# Patient Record
Sex: Male | Born: 1970 | Race: Black or African American | Hispanic: No | Marital: Single | Smoking: Current every day smoker
Health system: Southern US, Community
[De-identification: ages and names within clinical notes are randomized; demographics above are authoritative.]

## PROBLEM LIST (undated history)

## (undated) DIAGNOSIS — M109 Gout, unspecified: Secondary | ICD-10-CM

## (undated) HISTORY — PX: TONSILLECTOMY: SUR1361

---

## 2004-04-22 ENCOUNTER — Emergency Department (HOSPITAL_COMMUNITY): Admission: EM | Admit: 2004-04-22 | Discharge: 2004-04-22 | Payer: Self-pay | Admitting: Emergency Medicine

## 2007-10-16 ENCOUNTER — Inpatient Hospital Stay (HOSPITAL_COMMUNITY): Admission: EM | Admit: 2007-10-16 | Discharge: 2007-10-19 | Payer: Self-pay | Admitting: Emergency Medicine

## 2008-03-05 ENCOUNTER — Emergency Department (HOSPITAL_COMMUNITY): Admission: EM | Admit: 2008-03-05 | Discharge: 2008-03-05 | Payer: Self-pay | Admitting: Family Medicine

## 2008-08-25 ENCOUNTER — Emergency Department (HOSPITAL_COMMUNITY): Admission: EM | Admit: 2008-08-25 | Discharge: 2008-08-25 | Payer: Self-pay | Admitting: *Deleted

## 2010-07-15 ENCOUNTER — Emergency Department (HOSPITAL_COMMUNITY)
Admission: EM | Admit: 2010-07-15 | Discharge: 2010-07-15 | Payer: Self-pay | Source: Home / Self Care | Admitting: Emergency Medicine

## 2010-07-20 LAB — DIFFERENTIAL
Basophils Absolute: 0 10*3/uL (ref 0.0–0.1)
Basophils Relative: 0 % (ref 0–1)
Eosinophils Absolute: 0.2 10*3/uL (ref 0.0–0.7)
Eosinophils Relative: 3 % (ref 0–5)
Lymphocytes Relative: 45 % (ref 12–46)
Lymphs Abs: 3 10*3/uL (ref 0.7–4.0)
Monocytes Absolute: 0.6 10*3/uL (ref 0.1–1.0)
Monocytes Relative: 8 % (ref 3–12)
Neutro Abs: 3 10*3/uL (ref 1.7–7.7)
Neutrophils Relative %: 44 % (ref 43–77)

## 2010-07-20 LAB — BASIC METABOLIC PANEL
BUN: 12 mg/dL (ref 6–23)
CO2: 24 mEq/L (ref 19–32)
Calcium: 9.3 mg/dL (ref 8.4–10.5)
Chloride: 108 mEq/L (ref 96–112)
Creatinine, Ser: 1.22 mg/dL (ref 0.4–1.5)
GFR calc Af Amer: >60 mL/min (ref >60)
GFR calc non Af Amer: >60 mL/min (ref >60)
Glucose, Bld: 119 mg/dL — ABNORMAL HIGH (ref 70–99)
Potassium: 3.8 mEq/L (ref 3.5–5.1)
Sodium: 144 mEq/L (ref 135–145)

## 2010-07-20 LAB — POCT CARDIAC MARKERS
CKMB, poc: <1 ng/mL — ABNORMAL LOW (ref 1.0–8.0)
CKMB, poc: <1 ng/mL — ABNORMAL LOW (ref 1.0–8.0)
CKMB, poc: <1 ng/mL — ABNORMAL LOW (ref 1.0–8.0)
Myoglobin, poc: 67.5 ng/mL (ref 12–200)
Myoglobin, poc: 61.2 ng/mL (ref 12–200)
Myoglobin, poc: 68.2 ng/mL (ref 12–200)
Troponin i, poc: <0.05 ng/mL (ref 0.00–0.09)
Troponin i, poc: <0.05 ng/mL (ref 0.00–0.09)
Troponin i, poc: <0.05 ng/mL (ref 0.00–0.09)

## 2010-07-20 LAB — CBC
HCT: 42.6 % (ref 39.0–52.0)
Hemoglobin: 14.8 g/dL (ref 13.0–17.0)
MCH: 31.2 pg (ref 26.0–34.0)
MCHC: 34.7 g/dL (ref 30.0–36.0)
MCV: 89.7 fL (ref 78.0–100.0)
Platelets: 302 10*3/uL (ref 150–400)
RBC: 4.75 MIL/uL (ref 4.22–5.81)
RDW: 14 % (ref 11.5–15.5)
WBC: 6.8 10*3/uL (ref 4.0–10.5)

## 2010-07-20 LAB — D-DIMER, QUANTITATIVE: D-Dimer, Quant: 0.23 ug/mL-FEU (ref 0.00–0.48)

## 2010-09-20 ENCOUNTER — Inpatient Hospital Stay (INDEPENDENT_AMBULATORY_CARE_PROVIDER_SITE_OTHER)
Admission: RE | Admit: 2010-09-20 | Discharge: 2010-09-20 | Disposition: A | Discharge status: Home / Self Care | Payer: Self-pay | Source: Ambulatory Visit | Attending: Family Medicine | Admitting: Family Medicine

## 2010-09-20 DIAGNOSIS — M109 Gout, unspecified: Secondary | ICD-10-CM

## 2010-09-20 LAB — URIC ACID: Uric Acid, Serum: 9.1 mg/dL — ABNORMAL HIGH (ref 4.0–7.8)

## 2010-10-20 LAB — DIFFERENTIAL
Basophils Absolute: 0.1 10*3/uL (ref 0.0–0.1)
Basophils Relative: 1 % (ref 0–1)
Eosinophils Absolute: 0.2 10*3/uL (ref 0.0–0.7)
Eosinophils Relative: 2 % (ref 0–5)
Lymphocytes Relative: 38 % (ref 12–46)
Lymphs Abs: 2.6 10*3/uL (ref 0.7–4.0)
Monocytes Absolute: 0.7 10*3/uL (ref 0.1–1.0)
Monocytes Relative: 10 % (ref 3–12)
Neutro Abs: 3.4 10*3/uL (ref 1.7–7.7)
Neutrophils Relative %: 49 % (ref 43–77)

## 2010-10-20 LAB — CBC
HCT: 41.9 % (ref 39.0–52.0)
Hemoglobin: 14.8 g/dL (ref 13.0–17.0)
MCHC: 35.3 g/dL (ref 30.0–36.0)
MCV: 88.8 fL (ref 78.0–100.0)
Platelets: 269 10*3/uL (ref 150–400)
RBC: 4.72 MIL/uL (ref 4.22–5.81)
RDW: 14.1 % (ref 11.5–15.5)
WBC: 6.9 10*3/uL (ref 4.0–10.5)

## 2010-10-20 LAB — BASIC METABOLIC PANEL
BUN: 10 mg/dL (ref 6–23)
CO2: 28 mEq/L (ref 19–32)
Calcium: 9.2 mg/dL (ref 8.4–10.5)
Chloride: 103 mEq/L (ref 96–112)
Creatinine, Ser: 1.27 mg/dL (ref 0.4–1.5)
GFR calc Af Amer: >60 mL/min (ref >60)
GFR calc non Af Amer: >60 mL/min (ref >60)
Glucose, Bld: 111 mg/dL — ABNORMAL HIGH (ref 70–99)
Potassium: 3.7 mEq/L (ref 3.5–5.1)
Sodium: 140 mEq/L (ref 135–145)

## 2010-10-20 LAB — D-DIMER, QUANTITATIVE: D-Dimer, Quant: <0.22 ug/mL-FEU (ref 0.00–0.48)

## 2010-10-20 LAB — POCT CARDIAC MARKERS
CKMB, poc: <1 ng/mL — ABNORMAL LOW (ref 1.0–8.0)
Myoglobin, poc: 57.7 ng/mL (ref 12–200)
Troponin i, poc: <0.05 ng/mL (ref 0.00–0.09)

## 2010-11-17 NOTE — H&P (Signed)
NAME:  Patrick Hodges, Patrick Hodges NO.:  [id number]   MEDICAL RECORD NO.:  [id number]          PATIENT TYPE:  INP   LOCATION:  0105                         FACILITY:  [facility]   PHYSICIAN:  Excell Seltzer. Annabell Howells, M.D.    DATE OF BIRTH:  26-Apr-1971   DATE OF ADMISSION:  10/16/2007  DATE OF DISCHARGE:                              HISTORY & PHYSICAL   CHIEF COMPLAINT:  Right scrotal pain and swelling.   HISTORY:  Patrick Hodges is a 40 year old African American male who had the  onset Friday of pain and swelling in the right scrotum with fever.  He  was sent to see Dr. Freida Busman who referred him to me because of the scrotal  location of the abscess.  He has had a fever as high as 103 in the  emergency room. He has no voiding complaints.  He has had a swollen  gland in the neck.  The abscess actually spontaneously ruptured. He had  a little knot in that area of the scrotum for about a week and a half  prior to it becoming of full blown abscess.   PAST HISTORY:  Is pertinent for no drug allergies.   No current medications.   PAST MEDICAL HISTORY:  Medical history is pertinent for prior  tonsillectomy.   FAMILY HISTORY:  Is pertinent for early prostate cancer and the death of  his grandfather with prostate cancer and father requiring treatment in  his 35s.   SOCIAL HISTORY:  Is a half pack a day smoker, drinks alcohol socially.  Denies drugs.  He is an unemployed Software engineer.   REVIEW OF SYSTEMS:  He has had fever, chills.  He had no voiding  complaints.  Denies chest pain or shortness of breath.  He does report  the swollen, tender node in the left neck and he denies any abdominal or  GI complaints.  He denies GI complaints. Has had no myalgias. He is  otherwise entirely without complaints.   PHYSICAL EXAMINATION:  His temperature is 101.1, respirations 18, pulse  was 105.  GENERAL:  He is a well-developed, well-nourished black male in no acute  distress, alert and oriented x3.  Head and face normocephalic, atraumatic.  NECK:  Supple with a tender left anterior cervical lymph node that  measures approximately 2 cm.  LUNGS:  Clear with normal effort.  HEART:  Regular rate and rhythm.  ABDOMEN:  Soft, mildly obese, nontender without mass or  hepatosplenomegaly or CVA tenderness.  There are no hernias, but there  is a right inguinal adenopathy with some induration and tenderness.  GU:  Exam reveals unremarkable phallus with adequate meatus.  There is  obvious swelling of the right hemi scrotum with drainage from an abscess  in the dependent portion of the scrotum. The drainage is greenish and  foul-smelling. The testicle feels uninvolved without masses or  tenderness on the right; the left testicle and epididymis are  unremarkable.  Anus and perineum without lesions.  RECTAL:  Exam reveals normal sphincter tone.  Prostate is 1+ in size,  benign in consistency without nodules. Seminal vesicles are  nonpalpable.  No rectal masses are noted.  Rectal exam was actually performed at the  time of surgical procedure.  EXTREMITIES:  Have full range of motion without edema.  SKIN:  Warm and dry.  There no other lesions other than on the scrotum.  NEUROLOGICAL:  He is grossly intact.   IMPRESSION:  1. Scrotal abscess with fever and adenopathy.  2. Family history of prostate cancer.   PLAN:  A PSA, CBC and CMET were obtained as well as an RPR and HIV  titer. He was started on Unasyn 3 g IV q. 6 and Cipro 400 mg IV q. 12 in  preparation for I&D of the scrotal abscess which will be done later this  evening. The risks were explained in detail.      Excell Seltzer. Annabell Howells, M.D.  Electronically Signed     JJW/MEDQ  D:  10/16/2007  T:  10/16/2007  Job:  [id number]   cc:   Lennie Muckle, MD  [address]   [address]  [place] [address]

## 2010-11-17 NOTE — Op Note (Signed)
NAME:  Patrick, Hodges NO.:  [id number]   MEDICAL RECORD NO.:  [id number]          PATIENT TYPE:  INP   LOCATION:  0105                         FACILITY:  [facility]   PHYSICIAN:  Excell Seltzer. Annabell Howells, M.D.    DATE OF BIRTH:  Jan 23, 1971   DATE OF PROCEDURE:  10/16/2007  DATE OF DISCHARGE:                               OPERATIVE REPORT   PROCEDURE:  Incision and drainage of scrotal abscess.   PREOPERATIVE DIAGNOSIS:  Scrotal abscess.   POSTOPERATIVE DIAGNOSIS:  Scrotal abscess.   SURGEON:  Excell Seltzer. Annabell Howells, MD   ANESTHESIA:  General.   SPECIMENS:  Aerobic and anaerobic wound cultures.   DRAIN:  1-inch Iodoform gauze pack.   COMPLICATIONS:  None.   INDICATIONS:  [name] is a 40 year old white male who presented with  the onset Friday of fever and right scrotal swelling.  He was initially  seen by general surgery and was referred to me for the presence of an  abscess in the scrotum  He was seen in the emergency room and found to  have a draining right scrotal abscess with induration into the inguinal  region with lymphadenopathy in the inguinal region, but also in the left  neck, left anterior cervical region.  He had a fever to 103.  It was  felt that I and D of the abscess in the OR was indicated because of the  possibility of a Fournier's gangrene.   FINDINGS AT PROCEDURE:  The patient was taken to the operating room  after receiving Cipro and Unasyn.  General anesthetic was induced.  He  was placed in the lithotomy position.  His left inguinal area was  clipped.  His scrotum was prepped with Betadine solution.  He was draped  in the usual sterile fashion.  The abscess head partially drained  through spontaneous rupture.  It was located on the right lateral aspect  of the dependent portion of the scrotum.  The opening was enlarged to  approximately 3 cm with Bovie uncut and additional purulent material  drained out.  Aerobic and anaerobic cultures were taken.  The  wound was  then probed and the tissues were separated up into the inguinal area.  There was no evidence of crepitus.  No evidence of necrotic fascial  material.  It appeared most consistent with a large gram negative rod  scrotal abscess.  Once the wound had been probed both superiorly and  inferiorly in the scrotum, and all pockets of infection broken down, the  wound was packed with 1-inch Iodoform gauze 1.4 yards.  Once the packing  was in good position the wound was cleansed.  Dressing of 4 x 4's,  Fluffs, Kerlix, and athletic supporter was supplied.   The patient was taken down from arthrotomy position.  His anesthetic was  reversed.  He was moved to the recovery room in stable condition.  There  were no complications.      Excell Seltzer. Annabell Howells, M.D.  Electronically Signed     JJW/MEDQ  D:  10/16/2007  T:  10/16/2007  Job:  [id number]

## 2010-11-20 NOTE — Discharge Summary (Signed)
NAME:  Patrick Hodges, Patrick Hodges NO.:  [id number]   MEDICAL RECORD NO.:  [id number]          PATIENT TYPE:  INP   LOCATION:  1336                         FACILITY:  [facility]   PHYSICIAN:  Excell Seltzer. Annabell Howells, M.D.    DATE OF BIRTH:  1970-10-12   DATE OF ADMISSION:  10/16/2007  DATE OF DISCHARGE:  10/19/2007                               DISCHARGE SUMMARY   Briefly, [name] is a 40 year old African American male who was seen  in the emergency room the Friday prior to admission with pain and  swelling in the right scrotum with fever.  He was sent see Dr. Lennie Muckle initially but was referred on to me because of the scrotal  location of the abscess.  He had had a fever as high as 103 in the  emergency room and had had spontaneous rupture of the abscess.  On  examination in the office, he had a draining lesion in the right lateral  inferior portion of the scrotum with some tenderness and induration  superior to the area.  It was felt that admission for I&D and IV  antibiotics was indicated.   ALLERGIES:  No drug allergies.   MEDICATIONS:  He was on no medications at admission.   MEDICAL HISTORY:  Significant for prior tonsillectomy.   FAMILY HISTORY:  Pertinent for prostate cancer.   HOSPITAL COURSE:  The patient was admitted.  A PSA, CBC, CMET, RPR and  HIV titer were obtained, and he was started on Unisom 3 grams IV and  Cipro 400 mg IV q.12h.  Later in the evening he was taken to the  operating room where he underwent incision and drainage of his right  scrotal abscess.  Wound cultures, both aerobic and anaerobic type, were  obtained.  He did not appear to have evidence of Fournier's gangrene,  but the abscess cavity did extend into the superior scrotum.  The cavity  was incised and packed generously with 1 inch Iodoform gauze.   Initial laboratory studies included a white count of 12.4, hemoglobin of  13.3.  Chemistries were unremarkable with the exception of total  bili  1.4 which is minimally elevated.  PSA was 0.91.  His urinalysis had 21-  50 white cells, 3-6 red cells and few bacteria.  His Gram stain of this  wound had moderate gram negative rods, moderate gram positive cocci.  Abscess cultures revealed multiple organisms on the aerobic and no  anaerobic bacteria.   On the first postoperative day, he remained sore.  He had had an  enlarged right anterior cervical node that he felt was smaller, but his  T-max is 102.9.  His HIV and RPR titers were both negative as well.   On October 18, 2007, he was feeling better.  His T-max was 102.3 in the  past 24 hours, but he was afebrile on the morning of the exam.  He had  continued improvement in the node in his neck and reduced erythema  around his scrotal site.  He was switched to p.o. Bactrim which was felt  to cover both staff  and gram negative rods.   On October 19, 2007, he remained afebrile without any complaints.  The  neck node has stabilized.  His GU exam revealed reduced erythema.  Approximately 6 inches of the pack was removed.   FINAL DIAGNOSIS:  Scrotal abscess with  sepsis and adenopathy.   COMPLICATIONS:  There were no complications during his admission.   DISCHARGE MEDICATIONS:  1. Bactrim DS one p.o. b.i.d.  2. Vicodin.   DISCHARGE INSTRUCTIONS:  He was instructed to follow up in two weeks for  reevaluation and remove the packing gradually over that period of time.   DISPOSITION:  To home.   PROGNOSIS:  Good.   CONDITION:  Improved.      Excell Seltzer. Annabell Howells, M.D.  Electronically Signed     JJW/MEDQ  D:  11/13/2007  T:  11/13/2007  Job:  [id number]

## 2011-03-30 LAB — PSA: PSA: 0.91

## 2011-03-30 LAB — URINALYSIS, ROUTINE W REFLEX MICROSCOPIC
Bilirubin Urine: NEGATIVE
Glucose, UA: NEGATIVE
Ketones, ur: NEGATIVE
Nitrite: NEGATIVE
Protein, ur: NEGATIVE
Specific Gravity, Urine: 1.022
Urobilinogen, UA: 1
pH: 5.5

## 2011-03-30 LAB — CBC
HCT: 39.8
Hemoglobin: 13.9
MCHC: 34.9
MCV: 89
Platelets: 270
RBC: 4.47
RDW: 14.1
WBC: 12.4 — ABNORMAL HIGH

## 2011-03-30 LAB — ANAEROBIC CULTURE

## 2011-03-30 LAB — COMPREHENSIVE METABOLIC PANEL
ALT: 29
AST: 28
Albumin: 3.6
Alkaline Phosphatase: 57
BUN: 8
CO2: 27
Calcium: 8.8
Chloride: 104
Creatinine, Ser: 1.36
GFR calc Af Amer: >60
GFR calc non Af Amer: 59 — ABNORMAL LOW
Glucose, Bld: 85
Potassium: 3.6
Sodium: 137
Total Bilirubin: 1.4 — ABNORMAL HIGH
Total Protein: 6.9

## 2011-03-30 LAB — DIFFERENTIAL
Basophils Absolute: 0
Basophils Relative: 0
Eosinophils Absolute: 0.1
Eosinophils Relative: 1
Lymphocytes Relative: 10 — ABNORMAL LOW
Lymphs Abs: 1.3
Monocytes Absolute: 1.5 — ABNORMAL HIGH
Monocytes Relative: 12
Neutro Abs: 9.6 — ABNORMAL HIGH
Neutrophils Relative %: 77

## 2011-03-30 LAB — URINE MICROSCOPIC-ADD ON

## 2011-03-30 LAB — GRAM STAIN

## 2011-03-30 LAB — RPR: RPR Ser Ql: NONREACTIVE

## 2011-03-30 LAB — CULTURE, ROUTINE-ABSCESS

## 2011-03-30 LAB — HIV ANTIBODY (ROUTINE TESTING W REFLEX): HIV: NONREACTIVE

## 2013-11-12 ENCOUNTER — Encounter (HOSPITAL_COMMUNITY): Payer: Self-pay | Admitting: Emergency Medicine

## 2013-11-12 ENCOUNTER — Emergency Department (HOSPITAL_COMMUNITY)
Admission: EM | Admit: 2013-11-12 | Discharge: 2013-11-12 | Disposition: A | Discharge status: Home / Self Care | Payer: Self-pay | Attending: Emergency Medicine | Admitting: Emergency Medicine

## 2013-11-12 DIAGNOSIS — M62830 Muscle spasm of back: Secondary | ICD-10-CM

## 2013-11-12 DIAGNOSIS — F17200 Nicotine dependence, unspecified, uncomplicated: Secondary | ICD-10-CM | POA: Insufficient documentation

## 2013-11-12 DIAGNOSIS — M5430 Sciatica, unspecified side: Secondary | ICD-10-CM | POA: Insufficient documentation

## 2013-11-12 DIAGNOSIS — M5431 Sciatica, right side: Secondary | ICD-10-CM

## 2013-11-12 DIAGNOSIS — Z8639 Personal history of other endocrine, nutritional and metabolic disease: Secondary | ICD-10-CM | POA: Insufficient documentation

## 2013-11-12 DIAGNOSIS — Z862 Personal history of diseases of the blood and blood-forming organs and certain disorders involving the immune mechanism: Secondary | ICD-10-CM | POA: Insufficient documentation

## 2013-11-12 HISTORY — DX: Gout, unspecified: M10.9

## 2013-11-12 MED ORDER — METHOCARBAMOL 500 MG PO TABS
1000.0000 mg | ORAL_TABLET | Freq: Once | ORAL | Status: AC
  Administered 2013-11-12: 1000 mg via ORAL
  Filled 2013-11-12: qty 2

## 2013-11-12 MED ORDER — METHOCARBAMOL 500 MG PO TABS: 500.0000 mg | ORAL_TABLET | Freq: Two times a day (BID) | ORAL | Status: DC

## 2013-11-12 MED ORDER — MELOXICAM 7.5 MG PO TABS: 15.0000 mg | ORAL_TABLET | Freq: Every day | ORAL | Status: DC

## 2013-11-12 MED ORDER — OXYCODONE-ACETAMINOPHEN 5-325 MG PO TABS
1.0000 | ORAL_TABLET | Freq: Once | ORAL | Status: AC
  Administered 2013-11-12: 1 via ORAL
  Filled 2013-11-12: qty 1

## 2013-11-12 MED ORDER — KETOROLAC TROMETHAMINE 60 MG/2ML IM SOLN
60.0000 mg | Freq: Once | INTRAMUSCULAR | Status: DC
  Filled 2013-11-12: qty 2

## 2013-11-12 MED ORDER — OXYCODONE-ACETAMINOPHEN 5-325 MG PO TABS: ORAL_TABLET | ORAL | Status: DC

## 2013-11-12 NOTE — ED Provider Notes (Signed)
CSN: [id number]     Arrival date & time 11/12/13  0759 History   First MD Initiated Contact with Patient 11/12/13 [phone]     Chief Complaint  Patient presents with  . Back Pain     (Consider location/radiation/quality/duration/timing/severity/associated sxs/prior Treatment) HPI Pt is a 43yo male with hx of gout c/o right sided low back pain x2 days, gradually worsening, sharp and stabbing in nature, worse with ambulation and certain movements, 10/10. Pain radiates down right leg, stopping at the knee. Minimal relief with Aleve. Denies hx of trauma. Denies previous back pain or symptoms.  Denies fever, n/v/d. Denies hx of IVDU or Cancer. Denies change in urinary or bladder habits.   Past Medical History  Diagnosis Date  . Gout    Past Surgical History  Procedure Laterality Date  . Tonsillectomy     No family history on file. History  Substance Use Topics  . Smoking status: Current Every Day Smoker    Types: Cigarettes  . Smokeless tobacco: Never Used  . Alcohol Use: Yes     Comment: occasion    Review of Systems  Constitutional: Negative for fever and chills.  Respiratory: Negative for shortness of breath.   Cardiovascular: Negative for chest pain.  Gastrointestinal: Negative for nausea and vomiting.  Musculoskeletal: Positive for back pain and myalgias.  Skin: Negative for rash and wound.  Neurological: Negative for weakness and numbness.  All other systems reviewed and are negative.    Allergies  Review of patient's allergies indicates no known allergies.  Home Medications   Prior to Admission medications   Medication Sig Start Date End Date Taking? Authorizing Provider  meloxicam (MOBIC) 7.5 MG tablet Take 2 tablets (15 mg total) by mouth daily. 11/12/13   Junius FinnerErin O'Malley, PA-C  methocarbamol (ROBAXIN) 500 MG tablet Take 1 tablet (500 mg total) by mouth 2 (two) times daily. 11/12/13   Junius FinnerErin O'Malley, PA-C  oxyCODONE-acetaminophen (PERCOCET/ROXICET) 5-325 MG per tablet  Take 1-2 pills every 4-6 hours as needed for pain. 11/12/13   Junius FinnerErin O'Malley, PA-C   BP 130/83  Pulse 65  Temp(Src) 97.9 F (36.6 C) (Oral)  Resp 18  Ht 6' (1.829 m)  Wt 240 lb (108.863 kg)  BMI 32.54 kg/m2  SpO2 100% Physical Exam  Nursing note and vitals reviewed. Constitutional: He appears well-developed and well-nourished.  HENT:  Head: Normocephalic and atraumatic.  Eyes: Conjunctivae are normal. No scleral icterus.  Neck: Normal range of motion.  Cardiovascular: Normal rate, regular rhythm and normal heart sounds.   Pulmonary/Chest: Effort normal and breath sounds normal. No respiratory distress. He has no wheezes. He has no rales. He exhibits no tenderness.  Abdominal: Soft. Bowel sounds are normal. He exhibits no distension and no mass. There is no tenderness. There is no rebound and no guarding.  Musculoskeletal: Normal range of motion. He exhibits tenderness. He exhibits no edema.  Tenderness over right lower lumbar musculature. No midline spinal tenderness. FROM all 4 extremities. 5/5 grip strength, plantar flexion and dorsiflexion.  Neurological: He is alert.  Reflex Scores:      Patellar reflexes are 2+ on the right side and 2+ on the left side. Antalgic gait  Skin: Skin is warm and dry.    ED Course  Procedures (including critical care time) Labs Review Labs Reviewed - No data to display  Imaging Review No results found.   EKG Interpretation None      MDM   Final diagnoses:  Back muscle spasm  Right sciatic  nerve pain    Pt c/o low back pain, no hx of trauma. No red flag symptoms. Will tx for musculoskeletal pain. [facility]. Return precautions provided. Pt verbalized understanding and agreement with tx plan.     Junius Finnerrin O'Malley, PA-C 11/12/13 1445

## 2013-11-12 NOTE — Progress Notes (Signed)
[facility] did not get to see patient but will be sending information about [facility] [employer] Card to help patient establish primary care, using the address provided.  °

## 2013-11-12 NOTE — ED Notes (Signed)
Pt c/o mid/lower back pain that started on Saturday. Pt denies lifting, moving, injuring that could cause the pain. Pt states that he is having trouble walking,  Sitting, moving bc of the back pain.  Pt denies ever having back pain before.

## 2013-11-15 NOTE — ED Provider Notes (Signed)
Medical screening examination/treatment/procedure(s) were performed by non-physician practitioner and as supervising physician I was immediately available for consultation/collaboration.   EKG Interpretation None        Laray AngerKathleen M Monti Villers, DO 11/15/13 1025

## 2014-01-03 ENCOUNTER — Encounter (HOSPITAL_COMMUNITY): Payer: Self-pay | Admitting: Emergency Medicine

## 2014-01-03 ENCOUNTER — Emergency Department (HOSPITAL_COMMUNITY)
Admission: EM | Admit: 2014-01-03 | Discharge: 2014-01-03 | Disposition: A | Discharge status: Home / Self Care | Payer: Self-pay | Attending: Emergency Medicine | Admitting: Emergency Medicine

## 2014-01-03 DIAGNOSIS — F17200 Nicotine dependence, unspecified, uncomplicated: Secondary | ICD-10-CM | POA: Insufficient documentation

## 2014-01-03 DIAGNOSIS — Z791 Long term (current) use of non-steroidal anti-inflammatories (NSAID): Secondary | ICD-10-CM | POA: Insufficient documentation

## 2014-01-03 DIAGNOSIS — Z79899 Other long term (current) drug therapy: Secondary | ICD-10-CM | POA: Insufficient documentation

## 2014-01-03 DIAGNOSIS — M109 Gout, unspecified: Secondary | ICD-10-CM | POA: Insufficient documentation

## 2014-01-03 MED ORDER — INDOMETHACIN 50 MG PO CAPS: 50.0000 mg | ORAL_CAPSULE | Freq: Three times a day (TID) | ORAL | Status: DC

## 2014-01-03 MED ORDER — HYDROCODONE-ACETAMINOPHEN 5-325 MG PO TABS: 1.0000 | ORAL_TABLET | Freq: Four times a day (QID) | ORAL | Status: DC | PRN

## 2014-01-03 NOTE — ED Notes (Signed)
Pt c/o gout in left knee for past couple pf days. Pt states that he does have a PMH gout.

## 2014-01-03 NOTE — Progress Notes (Signed)
[facility] provided pt with a list of primary care resources and a [facility] [employer] application to help patient establish primary care.  °

## 2014-01-03 NOTE — ED Provider Notes (Addendum)
CSN: [id number]     Arrival date & time 01/03/14  0911 History   First MD Initiated Contact with Patient 01/03/14 [phone]     Chief Complaint  Patient presents with  . Gout     (Consider location/radiation/quality/duration/timing/severity/associated sxs/prior Treatment) Patient is a 43 y.o. male presenting with knee pain. The history is provided by the patient.  Knee Pain Location:  Knee Time since incident:  3 days Injury: no   Knee location:  L knee Pain details:    Quality:  Aching, throbbing and shooting   Radiates to:  Does not radiate   Severity:  Severe   Onset quality:  Gradual   Duration:  3 days   Timing:  Constant   Progression:  Worsening Chronicity:  Recurrent Relieved by:  Nothing Worsened by:  Activity, bearing weight, flexion and extension Ineffective treatments:  NSAIDs Associated symptoms: swelling   Associated symptoms: no fever and no muscle weakness   Risk factors comment:  Hx of gout   Past Medical History  Diagnosis Date  . Gout    Past Surgical History  Procedure Laterality Date  . Tonsillectomy     No family history on file. History  Substance Use Topics  . Smoking status: Current Every Day Smoker    Types: Cigarettes  . Smokeless tobacco: Never Used  . Alcohol Use: Yes     Comment: occasion    Review of Systems  Constitutional: Negative for fever.  All other systems reviewed and are negative.     Allergies  Review of patient's allergies indicates no known allergies.  Home Medications   Prior to Admission medications   Medication Sig Start Date End Date Taking? Authorizing Provider  meloxicam (MOBIC) 7.5 MG tablet Take 2 tablets (15 mg total) by mouth daily. 11/12/13   Junius FinnerErin O'Malley, PA-C  methocarbamol (ROBAXIN) 500 MG tablet Take 1 tablet (500 mg total) by mouth 2 (two) times daily. 11/12/13   Junius FinnerErin O'Malley, PA-C  oxyCODONE-acetaminophen (PERCOCET/ROXICET) 5-325 MG per tablet Take 1-2 pills every 4-6 hours as needed for pain.  11/12/13   Junius FinnerErin O'Malley, PA-C   BP 132/83  Pulse 81  Temp(Src) 98.4 F (36.9 C) (Oral)  Resp 17  SpO2 99% Physical Exam  Nursing note and vitals reviewed. Constitutional: He is oriented to person, place, and time. He appears well-developed and well-nourished. No distress.  HENT:  Head: Normocephalic and atraumatic.  Cardiovascular: Normal rate.   Pulmonary/Chest: Effort normal.  Musculoskeletal: He exhibits tenderness.       Left knee: He exhibits swelling. He exhibits normal range of motion and no erythema. Tenderness found. Medial joint line and lateral joint line tenderness noted.       Legs: Neurological: He is alert and oriented to person, place, and time.  Skin: Skin is warm and dry. No rash noted. No erythema.  Psychiatric: He has a normal mood and affect. His behavior is normal.    ED Course  Procedures (including critical care time) Labs Review Labs Reviewed - No data to display  Imaging Review No results found.   EKG Interpretation None      MDM   Final diagnoses:  Acute gout of left knee, unspecified cause    Patient with evidence of an acute gouty flare in his left knee. This is recurrent his last attack was approximately 2 years ago in the same knee.  No fever, or recent procedures were sign of a septic joint. Patient states in the past indomethacin has always resolved  his pain. He has no prior history of kidney issues and is otherwise healthy. Patient given indomethacin and followup.      Gwyneth SproutWhitney Keya Wynes, MD 01/03/14 [id number]  Gwyneth SproutWhitney Datra Clary, MD 01/03/14 [phone]

## 2015-10-28 ENCOUNTER — Emergency Department (HOSPITAL_COMMUNITY): Payer: Self-pay

## 2015-10-28 ENCOUNTER — Encounter (HOSPITAL_COMMUNITY): Payer: Self-pay

## 2015-10-28 ENCOUNTER — Emergency Department (HOSPITAL_COMMUNITY)
Admission: EM | Admit: 2015-10-28 | Discharge: 2015-10-28 | Disposition: A | Discharge status: Home / Self Care | Payer: Self-pay | Attending: Emergency Medicine | Admitting: Emergency Medicine

## 2015-10-28 DIAGNOSIS — R05 Cough: Secondary | ICD-10-CM

## 2015-10-28 DIAGNOSIS — M109 Gout, unspecified: Secondary | ICD-10-CM | POA: Insufficient documentation

## 2015-10-28 DIAGNOSIS — F17210 Nicotine dependence, cigarettes, uncomplicated: Secondary | ICD-10-CM | POA: Insufficient documentation

## 2015-10-28 DIAGNOSIS — R0789 Other chest pain: Secondary | ICD-10-CM | POA: Insufficient documentation

## 2015-10-28 DIAGNOSIS — J189 Pneumonia, unspecified organism: Secondary | ICD-10-CM

## 2015-10-28 DIAGNOSIS — Z79899 Other long term (current) drug therapy: Secondary | ICD-10-CM | POA: Insufficient documentation

## 2015-10-28 DIAGNOSIS — R059 Cough, unspecified: Secondary | ICD-10-CM

## 2015-10-28 DIAGNOSIS — J159 Unspecified bacterial pneumonia: Secondary | ICD-10-CM | POA: Insufficient documentation

## 2015-10-28 LAB — URINALYSIS, ROUTINE W REFLEX MICROSCOPIC
Bilirubin Urine: NEGATIVE
Glucose, UA: NEGATIVE mg/dL
Ketones, ur: NEGATIVE mg/dL
Nitrite: NEGATIVE
Protein, ur: NEGATIVE mg/dL
Specific Gravity, Urine: 1.025 (ref 1.005–1.030)
pH: 5.5 (ref 5.0–8.0)

## 2015-10-28 LAB — CBC
HCT: 40.8 % (ref 39.0–52.0)
Hemoglobin: 14.4 g/dL (ref 13.0–17.0)
MCH: 30.4 pg (ref 26.0–34.0)
MCHC: 35.3 g/dL (ref 30.0–36.0)
MCV: 86.3 fL (ref 78.0–100.0)
Platelets: 282 10*3/uL (ref 150–400)
RBC: 4.73 MIL/uL (ref 4.22–5.81)
RDW: 14 % (ref 11.5–15.5)
WBC: 9.3 10*3/uL (ref 4.0–10.5)

## 2015-10-28 LAB — I-STAT TROPONIN, ED: Troponin i, poc: 0 ng/mL (ref 0.00–0.08)

## 2015-10-28 LAB — TROPONIN I: Troponin I: <0.03 ng/mL (ref <0.031)

## 2015-10-28 LAB — I-STAT CHEM 8, ED
BUN: 18 mg/dL (ref 6–20)
Calcium, Ion: 1.14 mmol/L (ref 1.12–1.23)
Chloride: 102 mmol/L (ref 101–111)
Creatinine, Ser: 1.3 mg/dL — ABNORMAL HIGH (ref 0.61–1.24)
Glucose, Bld: 144 mg/dL — ABNORMAL HIGH (ref 65–99)
HCT: 45 % (ref 39.0–52.0)
Hemoglobin: 15.3 g/dL (ref 13.0–17.0)
Potassium: 5.6 mmol/L — ABNORMAL HIGH (ref 3.5–5.1)
Sodium: 139 mmol/L (ref 135–145)
TCO2: 30 mmol/L (ref 0–100)

## 2015-10-28 LAB — BRAIN NATRIURETIC PEPTIDE: B Natriuretic Peptide: 14.4 pg/mL (ref 0.0–100.0)

## 2015-10-28 LAB — POTASSIUM: Potassium: 3.7 mmol/L (ref 3.5–5.1)

## 2015-10-28 LAB — URINE MICROSCOPIC-ADD ON

## 2015-10-28 MED ORDER — OXYCODONE-ACETAMINOPHEN 5-325 MG PO TABS: 1.0000 | ORAL_TABLET | Freq: Four times a day (QID) | ORAL | Status: DC | PRN

## 2015-10-28 MED ORDER — LEVOFLOXACIN 500 MG PO TABS
500.0000 mg | ORAL_TABLET | Freq: Once | ORAL | Status: AC
  Administered 2015-10-28: 500 mg via ORAL
  Filled 2015-10-28: qty 1

## 2015-10-28 MED ORDER — SODIUM POLYSTYRENE SULFONATE 15 GM/60ML PO SUSP
15.0000 g | Freq: Once | ORAL | Status: AC
  Administered 2015-10-28: 15 g via ORAL
  Filled 2015-10-28: qty 60

## 2015-10-28 MED ORDER — MOXIFLOXACIN HCL 400 MG PO TABS: 400.0000 mg | ORAL_TABLET | Freq: Every day | ORAL | Status: DC

## 2015-10-28 MED ORDER — OXYCODONE-ACETAMINOPHEN 5-325 MG PO TABS
1.0000 | ORAL_TABLET | Freq: Once | ORAL | Status: AC
  Administered 2015-10-28: 1 via ORAL
  Filled 2015-10-28: qty 1

## 2015-10-28 NOTE — Discharge Instructions (Signed)
Return to the ED with any concerns including difficulty breathing, worsening pain, fainting, vomiting, or any other alarming symptoms  You should be sure to use the incentive spirometer 10 times every hour while awake

## 2015-10-28 NOTE — ED Provider Notes (Signed)
7:08 PM Called by Joyce GrossKay in flow manager's office. Pharmacy called as patient is unable to afford the Avelox. Requesting change to generic Levaquin or to Ciprofloxacin. Verbal order given for Levaquin 750mg  QD x 5 days.  Antony MaduraKelly Kuba Shepherd, PA-C 10/28/15 1910  Rolan BuccoMelanie Belfi, MD 10/28/15 2041

## 2015-10-28 NOTE — ED Provider Notes (Signed)
CSN: [id number]     Arrival date & time 10/28/15  0547 History   First MD Initiated Contact with Patient 10/28/15 0756     Chief Complaint  Patient presents with  . Chest Pain     (Consider location/radiation/quality/duration/timing/severity/associated sxs/prior Treatment) HPI  Pt presenting with c/o left sided chest wall pain as well as pain in upper left back. He states he has also had cough- pain is worse with coughing.  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  He also states the cough started at that same time.  No fever/chlls.  No leg swelling.  No hx of DVT/PE, no recent travel/trauma/surgery.  He has not had any treatment prior to arrival.  There are no other associated systemic symptoms, there are no other alleviating or modifying factors.   Past Medical History  Diagnosis Date  . Gout    Past Surgical History  Procedure Laterality Date  . Tonsillectomy     History reviewed. No pertinent family history. Social History  Substance Use Topics  . Smoking status: Current Every Day Smoker    Types: Cigarettes  . Smokeless tobacco: Never Used  . Alcohol Use: Yes     Comment: occasion    Review of Systems  ROS reviewed and all otherwise negative except for mentioned in HPI    Allergies  Review of patient's allergies indicates no known allergies.  Home Medications   Prior to Admission medications   Medication Sig Start Date End Date Taking? Authorizing Provider  Doxylamine Succinate, Sleep, (SLEEP AID PO) Take 2 tablets by mouth at bedtime as needed (sleep).   Yes Historical Provider, MD  indomethacin (INDOCIN) 50 MG capsule Take 1 capsule (50 mg total) by mouth 3 (three) times daily with meals. Stop medication when pain resolves Patient taking differently: Take 50 mg by mouth 2 (two) times daily as  needed (gout pain).  01/03/14  Yes Gwyneth Sprout, MD  naproxen sodium (ANAPROX) 220 MG tablet Take 880-1,100 mg by mouth 2 (two) times daily as needed (pain).   Yes Historical Provider, MD  Omega-3 Fatty Acids (FISH OIL) 1000 MG CAPS Take 3,000 mg by mouth daily.   Yes Historical Provider, MD  moxifloxacin (AVELOX) 400 MG tablet Take 1 tablet (400 mg total) by mouth daily at 8 pm. 10/28/15   Jerelyn Scott, MD  oxyCODONE-acetaminophen (PERCOCET/ROXICET) 5-325 MG tablet Take 1-2 tablets by mouth every 6 (six) hours as needed for severe pain. 10/28/15   Jerelyn Scott, MD   BP 123/94 mmHg  Pulse 84  Temp(Src) 98.6 F (37 C) (Oral)  Resp 15  Ht 6' (1.829 m)  Wt 104.327 kg  BMI 31.19 kg/m2  SpO2 98%  Vitals reviewed Physical Exam  Physical Examination: General appearance - alert, well appearing, and in no distress Mental status - alert, oriented to person, place, and time Eyes - no conjunctival injection, no scleral icterus Mouth - mucous membranes moist, pharynx normal without lesions Neck - supple, no significant adenopathy Chest - clear to auscultation, no wheezes, rales or rhonchi, symmetric air entry, ttp over left lateral chest wall and left posterior chest wall/upper back, normal respiratory effort Heart - normal rate, regular rhythm, normal S1, S2, no murmurs, rubs, clicks or gallops Abdomen - soft, nontender, nondistended, no masses or organomegaly Neurological - alert, oriented,  normal speech Extremities - peripheral pulses normal, no pedal edema, no clubbing or cyanosis Skin - normal coloration and turgor, no rashes  ED Course  Procedures (including critical care time) Labs Review Labs Reviewed  URINALYSIS, ROUTINE W REFLEX MICROSCOPIC (NOT AT [facility]) - Abnormal; Notable for the following:    Hgb urine dipstick SMALL (*)    Leukocytes, UA SMALL (*)    All other components within normal limits  URINE MICROSCOPIC-ADD ON - Abnormal; Notable for the following:    Squamous  Epithelial / LPF 0-5 (*)    Bacteria, UA FEW (*)    All other components within normal limits  I-STAT CHEM 8, ED - Abnormal; Notable for the following:    Potassium 5.6 (*)    Creatinine, Ser 1.30 (*)    Glucose, Bld 144 (*)    All other components within normal limits  CBC  BRAIN NATRIURETIC PEPTIDE  TROPONIN I  POTASSIUM  I-STAT TROPOININ, ED    Imaging Review Dg Chest 2 View  10/28/2015  CLINICAL DATA:  Initial evaluation for acute chest pain. EXAM: CHEST  2 VIEW COMPARISON:  Prior study from 07/15/2010. FINDINGS: Cardiac and mediastinal silhouettes are within normal limits. Lungs are normally inflated. Mild left basilar patchy and linear opacity, which may reflect atelectasis or infiltrate. No other focal airspace disease. No pulmonary edema or pleural effusion. No pneumothorax. No acute osseus abnormality. IMPRESSION: Patchy and linear left basilar opacity. Finding may reflect atelectasis or infiltrate. Electronically Signed   By: Rise MuBenjamin  McClintock M.D.   On: 10/28/2015 06:27   I have personally reviewed and evaluated these images and lab results as part of my medical decision-making.   EKG Interpretation   Date/Time:  Tuesday October 28 2015 05:55:06 EDT Ventricular Rate:  100 PR Interval:  151 QRS Duration: 91 QT Interval:  329 QTC Calculation: 424 R Axis:   -101 Text Interpretation:  Sinus tachycardia Ventricular premature complex  Markedly posterior QRS axis Baseline wander in lead(s) II III aVR aVF V1  No significant change since last tracing Confirmed by [facility]  MD, MARTHA  [phone]([id number]) on 10/28/2015 8:23:09 AM      MDM   Final diagnoses:  Cough  Chest wall pain  Community acquired pneumonia    Pt presenting with cough and chest wall pain.  CXR shows left sided infiltrate.  Pt given pain medication, incentive spirometer as he is splinting due to pain, also started on abx.  Labs are reassuring, doubt ACS, doubt PE or other acute emergent process.  Discharged with  strict return precautions.  Pt agreeable with plan.   Jerelyn ScottMartha Linker, MD 10/29/15 [phone]

## 2015-10-28 NOTE — ED Notes (Signed)
Pt complains of chest pain for 4 days, he describes it as pressure and like someone hit him,

## 2017-02-14 ENCOUNTER — Encounter (HOSPITAL_COMMUNITY): Payer: Self-pay | Admitting: Emergency Medicine

## 2017-02-14 ENCOUNTER — Emergency Department (HOSPITAL_COMMUNITY)
Admission: EM | Admit: 2017-02-14 | Discharge: 2017-02-14 | Disposition: A | Discharge status: Home / Self Care | Payer: BLUE CROSS/BLUE SHIELD | Attending: Emergency Medicine | Admitting: Emergency Medicine

## 2017-02-14 DIAGNOSIS — M25562 Pain in left knee: Secondary | ICD-10-CM | POA: Diagnosis present

## 2017-02-14 DIAGNOSIS — M10062 Idiopathic gout, left knee: Secondary | ICD-10-CM | POA: Insufficient documentation

## 2017-02-14 DIAGNOSIS — F17210 Nicotine dependence, cigarettes, uncomplicated: Secondary | ICD-10-CM | POA: Insufficient documentation

## 2017-02-14 DIAGNOSIS — Z79899 Other long term (current) drug therapy: Secondary | ICD-10-CM | POA: Diagnosis not present

## 2017-02-14 MED ORDER — INDOMETHACIN 25 MG PO CAPS
50.0000 mg | ORAL_CAPSULE | Freq: Once | ORAL | Status: AC
  Administered 2017-02-14: 50 mg via ORAL
  Filled 2017-02-14: qty 2

## 2017-02-14 MED ORDER — INDOMETHACIN 50 MG PO CAPS: 50.0000 mg | ORAL_CAPSULE | Freq: Two times a day (BID) | ORAL | 0 refills | Status: DC

## 2017-02-14 NOTE — ED Provider Notes (Signed)
[facility]-EMERGENCY DEPT Provider Note   CSN: [id number] Arrival date & time: 02/14/17  1341   By signing my name below, I, [name], attest that this documentation has been prepared under the direction and in the presence of Kerrie Buffalo, NP Electronically Signed: [name], ED Scribe. 02/14/17. 4:18 PM.  History   Chief Complaint Chief Complaint  Patient presents with  . Gout   The history is provided by the patient. No language interpreter was used.  Knee Pain   This is a new problem. The current episode started 12 to 24 hours ago. The problem has been gradually worsening. The pain is present in the left knee and left elbow. The pain is moderate. Pertinent negatives include no numbness and full range of motion (pain with ROM.). Family history is significant for gout.   HPI Comments: Patrick Hodges is a 46 y.o. male with a h/x of gout, who presents to the Emergency Department complaining of gradually worsening, moderate left elbow and left knee pain with associated joint swelling that began this morning. The pt states that his pain is similar to his gout in other locations, but his current symptoms are more severe. Ambulation and movement of these joints exacerbate his pain. The pt states that he took some NSAIDs with no relief of his symptoms. He denies fever, chills, SOB, abdominal pain, diarrhea, nausea, and emesis. He reports that when it gets this bad he usually needs Indocin.   Past Medical History:  Diagnosis Date  . Gout     There are no active problems to display for this patient.   Past Surgical History:  Procedure Laterality Date  . TONSILLECTOMY         Home Medications    Prior to Admission medications   Medication Sig Start Date End Date Taking? Authorizing Provider  Doxylamine Succinate, Sleep, (SLEEP AID PO) Take 2 tablets by mouth at bedtime as needed (sleep).    [provider]  indomethacin (INDOCIN) 50 MG capsule Take 1 capsule (50  mg total) by mouth 2 (two) times daily with a meal. 02/14/17   Damian Leavell, [place], NP  moxifloxacin (AVELOX) 400 MG tablet Take 1 tablet (400 mg total) by mouth daily at 8 pm. 10/28/15   Mabe, Latanya Maudlin, MD  naproxen sodium (ANAPROX) 220 MG tablet Take 880-1,100 mg by mouth 2 (two) times daily as needed (pain).    [provider]  Omega-3 Fatty Acids (FISH OIL) 1000 MG CAPS Take 3,000 mg by mouth daily.    [provider]  oxyCODONE-acetaminophen (PERCOCET/ROXICET) 5-325 MG tablet Take 1-2 tablets by mouth every 6 (six) hours as needed for severe pain. 10/28/15   Mabe, Latanya Maudlin, MD    Family History History reviewed. No pertinent family history.  Social History Social History  Substance Use Topics  . Smoking status: Current Every Day Smoker    Types: Cigarettes  . Smokeless tobacco: Never Used  . Alcohol use Yes     Comment: occasion     Allergies   Patient has no known allergies.   Review of Systems Review of Systems  Constitutional: Negative for chills and fever.  HENT: Negative.   Respiratory: Negative for shortness of breath.   Gastrointestinal: Negative for abdominal pain, diarrhea and vomiting.  Musculoskeletal: Positive for arthralgias and joint swelling.  Skin: Negative for rash and wound.  Neurological: Negative for numbness.  Psychiatric/Behavioral: The patient is not nervous/anxious.      Physical Exam Updated Vital Signs BP  133/85 (BP Location: Right Arm)   Pulse 88   Temp 97.8 F (36.6 C) (Oral)   Resp 18   SpO2 99%   Physical Exam  Constitutional: He is oriented to person, place, and time. He appears well-developed and well-nourished. No distress.  HENT:  Head: Normocephalic.  Eyes: EOM are normal.  Neck: Normal range of motion.  Cardiovascular: Normal rate.    Pedal pulses 2+.  Pulmonary/Chest: Effort normal.  Musculoskeletal:       Left knee: He exhibits swelling. He exhibits no ecchymosis, no deformity, no laceration and normal  alignment. Decreased range of motion: due to pain. Tenderness found.  Left knee is tender to the anterior aspect. There is swelling and increased warmth. Pain with ROM. Minimal sweling to anterior aspect of left elbow, o increased warmth.  Neurological: He is alert and oriented to person, place, and time.  Skin: Skin is warm and dry.  Psychiatric: He has a normal mood and affect.  Nursing note and vitals reviewed.    ED Treatments / Results   DIAGNOSTIC STUDIES: Oxygen Saturation is 97% on RA, normal by my interpretation.   COORDINATION OF CARE: 4:12 PM-Discussed next steps with pt. Pt verbalized understanding and is agreeable with the plan.   Labs (all labs ordered are listed, but only abnormal results are displayed) Labs Reviewed - No data to display Radiology No results found.  Procedures Procedures (including critical care time)  Medications Ordered in ED Medications  indomethacin (INDOCIN) capsule 50 mg (50 mg Oral Given 02/14/17 1653)     Initial Impression / Assessment and Plan / ED Course  I have reviewed the triage vital signs and the nursing notes. 46 y.o. male with hx of gout here today with symptoms similar to his other gout attacks. Pt without known peptic ulcer disease and not receiving concurrent treatment on warfarin. Pt dc with indomethacin (50 mg PO BID). Discussed return precautions and need for f/u with PCP. Patient agrees with plan.   Final Clinical Impressions(s) / ED Diagnoses   Final diagnoses:  Acute idiopathic gout of left knee    New Prescriptions Discharge Medication List as of 02/14/2017  4:27 PM    I personally performed the services described in this documentation, which was scribed in my presence. The recorded information has been reviewed and is accurate.    Kerrie Buffaloeese, Hope Holloman AFBM, [place]NP 02/14/17 2259    Nira Connardama, Pedro Eduardo, MD 02/15/17 [phone]

## 2017-02-14 NOTE — ED Notes (Signed)
Sent pharmacy a message to verify indocin

## 2017-02-14 NOTE — ED Triage Notes (Signed)
Pt states he woke up w/ pain in his L elbow and knee today that feels similar to his gout pain. Alert and oriented.

## 2017-02-14 NOTE — ED Notes (Signed)
Pt ambulatory and independent at discharge.  Verbalized understanding of discharge instructions 

## 2017-02-16 ENCOUNTER — Emergency Department (HOSPITAL_COMMUNITY)
Admission: EM | Admit: 2017-02-16 | Discharge: 2017-02-16 | Disposition: A | Discharge status: Home / Self Care | Payer: BLUE CROSS/BLUE SHIELD | Attending: Emergency Medicine | Admitting: Emergency Medicine

## 2017-02-16 ENCOUNTER — Encounter (HOSPITAL_COMMUNITY): Payer: Self-pay

## 2017-02-16 DIAGNOSIS — M10462 Other secondary gout, left knee: Secondary | ICD-10-CM

## 2017-02-16 DIAGNOSIS — M130 Polyarthritis, unspecified: Secondary | ICD-10-CM | POA: Diagnosis not present

## 2017-02-16 DIAGNOSIS — M79662 Pain in left lower leg: Secondary | ICD-10-CM | POA: Insufficient documentation

## 2017-02-16 DIAGNOSIS — M109 Gout, unspecified: Secondary | ICD-10-CM | POA: Insufficient documentation

## 2017-02-16 DIAGNOSIS — F17210 Nicotine dependence, cigarettes, uncomplicated: Secondary | ICD-10-CM | POA: Diagnosis not present

## 2017-02-16 DIAGNOSIS — M2550 Pain in unspecified joint: Secondary | ICD-10-CM

## 2017-02-16 DIAGNOSIS — M25522 Pain in left elbow: Secondary | ICD-10-CM | POA: Diagnosis present

## 2017-02-16 DIAGNOSIS — Z79899 Other long term (current) drug therapy: Secondary | ICD-10-CM | POA: Insufficient documentation

## 2017-02-16 DIAGNOSIS — M2540 Effusion, unspecified joint: Secondary | ICD-10-CM | POA: Insufficient documentation

## 2017-02-16 LAB — URINALYSIS, ROUTINE W REFLEX MICROSCOPIC
Bilirubin Urine: NEGATIVE
Glucose, UA: NEGATIVE mg/dL
Ketones, ur: NEGATIVE mg/dL
Nitrite: NEGATIVE
Protein, ur: NEGATIVE mg/dL
Specific Gravity, Urine: 1.021 (ref 1.005–1.030)
pH: 5 (ref 5.0–8.0)

## 2017-02-16 LAB — CBC WITH DIFFERENTIAL/PLATELET
Basophils Absolute: 0 10*3/uL (ref 0.0–0.1)
Basophils Relative: 0 %
Eosinophils Absolute: 0.2 10*3/uL (ref 0.0–0.7)
Eosinophils Relative: 2 %
HCT: 42.5 % (ref 39.0–52.0)
Hemoglobin: 14.7 g/dL (ref 13.0–17.0)
Lymphocytes Relative: 38 %
Lymphs Abs: 3.3 10*3/uL (ref 0.7–4.0)
MCH: 30.1 pg (ref 26.0–34.0)
MCHC: 34.6 g/dL (ref 30.0–36.0)
MCV: 86.9 fL (ref 78.0–100.0)
Monocytes Absolute: 1.2 10*3/uL — ABNORMAL HIGH (ref 0.1–1.0)
Monocytes Relative: 13 %
Neutro Abs: 4 10*3/uL (ref 1.7–7.7)
Neutrophils Relative %: 47 %
Platelets: 310 10*3/uL (ref 150–400)
RBC: 4.89 MIL/uL (ref 4.22–5.81)
RDW: 13.7 % (ref 11.5–15.5)
WBC: 8.7 10*3/uL (ref 4.0–10.5)

## 2017-02-16 LAB — SYNOVIAL CELL COUNT + DIFF, W/ CRYSTALS
Monocyte-Macrophage-Synovial Fluid: 2 % — ABNORMAL LOW (ref 50–90)
Neutrophil, Synovial: 98 % — ABNORMAL HIGH (ref 0–25)
WBC, Synovial: 59220 /mm3 — ABNORMAL HIGH (ref 0–200)

## 2017-02-16 LAB — BASIC METABOLIC PANEL
Anion gap: 9 (ref 5–15)
BUN: 18 mg/dL (ref 6–20)
CO2: 26 mmol/L (ref 22–32)
Calcium: 8.9 mg/dL (ref 8.9–10.3)
Chloride: 103 mmol/L (ref 101–111)
Creatinine, Ser: 1.4 mg/dL — ABNORMAL HIGH (ref 0.61–1.24)
GFR calc Af Amer: >60 mL/min (ref >60)
GFR calc non Af Amer: 58 mL/min — ABNORMAL LOW (ref >60)
Glucose, Bld: 103 mg/dL — ABNORMAL HIGH (ref 65–99)
Potassium: 3.7 mmol/L (ref 3.5–5.1)
Sodium: 138 mmol/L (ref 135–145)

## 2017-02-16 LAB — SEDIMENTATION RATE: Sed Rate: 27 mm/hr — ABNORMAL HIGH (ref 0–16)

## 2017-02-16 LAB — I-STAT CG4 LACTIC ACID, ED: Lactic Acid, Venous: 0.63 mmol/L (ref 0.5–1.9)

## 2017-02-16 MED ORDER — OXYCODONE-ACETAMINOPHEN 5-325 MG PO TABS
2.0000 | ORAL_TABLET | Freq: Once | ORAL | Status: AC
  Administered 2017-02-16: 2 via ORAL
  Filled 2017-02-16: qty 2

## 2017-02-16 MED ORDER — DEXTROSE 5 % IV SOLN
1.0000 g | Freq: Once | INTRAVENOUS | Status: AC
  Administered 2017-02-16: 1 g via INTRAVENOUS
  Filled 2017-02-16: qty 10

## 2017-02-16 MED ORDER — LIDOCAINE HCL (PF) 1 % IJ SOLN: 5.0000 mL | Freq: Once | INTRAMUSCULAR | Status: DC

## 2017-02-16 MED ORDER — LIDOCAINE HCL 1 % IJ SOLN
INTRAMUSCULAR | Status: AC
  Administered 2017-02-16: 20 mL
  Filled 2017-02-16: qty 20

## 2017-02-16 MED ORDER — SODIUM CHLORIDE 0.9 % IV BOLUS (SEPSIS)
1000.0000 mL | Freq: Once | INTRAVENOUS | Status: AC
  Administered 2017-02-16: 1000 mL via INTRAVENOUS

## 2017-02-16 MED ORDER — HYDROCODONE-ACETAMINOPHEN 5-325 MG PO TABS: 1.0000 | ORAL_TABLET | Freq: Three times a day (TID) | ORAL | 0 refills | Status: AC | PRN

## 2017-02-16 MED ORDER — VANCOMYCIN HCL IN DEXTROSE 1-5 GM/200ML-% IV SOLN
1000.0000 mg | Freq: Once | INTRAVENOUS | Status: AC
  Administered 2017-02-16: 1000 mg via INTRAVENOUS
  Filled 2017-02-16: qty 200

## 2017-02-16 MED ORDER — ACETAMINOPHEN 500 MG PO TABS: 1000.0000 mg | ORAL_TABLET | Freq: Three times a day (TID) | ORAL | 0 refills | Status: AC

## 2017-02-16 MED ORDER — CEFIXIME 400 MG PO CAPS: 400.0000 mg | ORAL_CAPSULE | Freq: Two times a day (BID) | ORAL | 0 refills | Status: AC

## 2017-02-16 NOTE — ED Provider Notes (Signed)
I assumed care of this patient from Dr. Nicanor AlconPalumbo and Dierdre ForthHannah Muthersbaugh, PA  at 0800.  Please see their note for further details of Hx, PE.  Briefly patient is a 46 y.o. male with a history of gout who presents with persistent left elbow, left the, left ankle pain that has been worsening for the past several days. Patient was seen previously for the same and treated as a gout flare however reports that the culture seen and is not providing him any relief. Reports that typically this does provide her relief. Arthrocentesis was obtained which revealed elevated WBCs at almost 16,000 with 98% PMNs. There is a concern for possible septic arthritis. Additional labs obtained. Gram stain pending. Patient was given empiric Rocephin. Current plan is to follow-up workup and treat appropriately..  Gram stain revealed uric acid crystals with no organisms identified. Other labs are grossly reassuring. I discussed the case with orthopedic surgery who felt that this was most consistent with a gout flare. I spoke to the patient again and no obvious infectious source was identified including possible gonorrhea. He was monitored for several hours without any change and his status. Feel that he is appropriate for discharge. We'll provide the patient with empiric antibiotics. He is to follow-up with Dr. Jerl Santosalldorf in 2 days.  We discussed the low probability but concern for possible infectious joint with the patient. I also discussed admission versus discharge and patient felt comfortable with being discharged home on antibiotics and close orthopedic follow-up.  We'll continue to follow up cultures.  The patient is safe for discharge with strict return precautions.  Disposition: Discharge  Condition: Good  I have discussed the results, Dx and Tx plan with the patient who expressed understanding and agree(s) with the plan. Discharge instructions discussed at great length. The patient was given strict return precautions who  verbalized understanding of the instructions. No further questions at time of discharge.    Discharge Medication List as of 02/16/2017  2:40 PM    START taking these medications   Details  Cefixime (SUPRAX) 400 MG CAPS capsule Take 1 capsule (400 mg total) by mouth 2 (two) times daily., Starting Wed 02/16/2017, Until Wed 02/23/2017, Print    HYDROcodone-acetaminophen (NORCO/VICODIN) 5-325 MG tablet Take 1 tablet by mouth every 8 (eight) hours as needed for severe pain (That is not improved by your scheduled acetaminophen regimen). Please do not exceed 4000 mg of acetaminophen (Tylenol) a 24-hour period. Please note that he may be prescribed additio nal medicine that contains acetaminophen., Starting Wed 02/16/2017, Until Mon 02/21/2017, Print        Follow Up: Marcene Corningalldorf, Peter, MD [address] [place] [id number] [phone]  Schedule an appointment as soon as possible for a visit on 02/18/2017 For close follow up to assess for gout flare      02/17/17 2:33 PM Blood cultures without growth  24 hours   Gillie Fleites, Amadeo GarnetPedro Eduardo, MD 02/17/17 1434

## 2017-02-16 NOTE — ED Notes (Signed)
Bed: WTR6 Expected date:  Expected time:  Means of arrival:  Comments: 

## 2017-02-16 NOTE — ED Notes (Signed)
Bed: [id number] Expected date:  Expected time:  Means of arrival:  Comments: TR 6

## 2017-02-16 NOTE — ED Provider Notes (Signed)
[facility]-EMERGENCY DEPT Provider Note   CSN: [id number] Arrival date & time: 02/16/17  0242     History   Chief Complaint Chief Complaint  Patient presents with  . Gout    HPI Patrick Hodges is a 46 y.o. male with a hx of Gout presents to the Emergency Department complaining of gradual, persistent, progressively worsening left elbow, left knee and left ankle onset or days ago. Patient reports that he has had pain in the sites from gout in the past. He reports treatment with indomethacin has always resolved his symptoms however at this time his pain continues to worsen.  Patient denies a history of diabetes or anticoagulant usage. He denies recent penile discharge or gonorrhea infection. He denies IV drug use. He denies history of septic joint. Patient denies fevers or chills, nausea or vomiting. He states the pain in his knee is unbearable and he has difficulty ambulating because of it. Nothing seems to make the symptoms better. Nothing to palpation makes them worse. He denies numbness, tingling, weakness.  The history is provided by the patient and medical records. No language interpreter was used.    Past Medical History:  Diagnosis Date  . Gout     There are no active problems to display for this patient.   Past Surgical History:  Procedure Laterality Date  . TONSILLECTOMY         Home Medications    Prior to Admission medications   Medication Sig Start Date End Date Taking? Authorizing Provider  Doxylamine Succinate, Sleep, (SLEEP AID PO) Take 2 tablets by mouth at bedtime as needed (sleep).    [provider]  indomethacin (INDOCIN) 50 MG capsule Take 1 capsule (50 mg total) by mouth 2 (two) times daily with a meal. 02/14/17   Damian Leavell, [place], NP  moxifloxacin (AVELOX) 400 MG tablet Take 1 tablet (400 mg total) by mouth daily at 8 pm. 10/28/15   Mabe, Latanya Maudlin, MD  naproxen sodium (ANAPROX) 220 MG tablet Take 880-1,100 mg by mouth 2 (two) times daily as needed  (pain).    [provider]  Omega-3 Fatty Acids (FISH OIL) 1000 MG CAPS Take 3,000 mg by mouth daily.    [provider]  oxyCODONE-acetaminophen (PERCOCET/ROXICET) 5-325 MG tablet Take 1-2 tablets by mouth every 6 (six) hours as needed for severe pain. 10/28/15   Mabe, Latanya Maudlin, MD    Family History History reviewed. No pertinent family history.  Social History Social History  Substance Use Topics  . Smoking status: Current Every Day Smoker    Types: Cigarettes  . Smokeless tobacco: Never Used  . Alcohol use Yes     Comment: occasion     Allergies   Patient has no known allergies.   Review of Systems Review of Systems  Constitutional: Negative for chills and fever.  Gastrointestinal: Negative for nausea and vomiting.  Musculoskeletal: Positive for arthralgias and joint swelling. Negative for back pain, neck pain and neck stiffness.  Skin: Negative for wound.  Neurological: Negative for numbness.  Hematological: Does not bruise/bleed easily.  Psychiatric/Behavioral: The patient is not nervous/anxious.   All other systems reviewed and are negative.    Physical Exam Updated Vital Signs BP 124/85 (BP Location: Left Arm)   Pulse 99   Temp 100.2 F (37.9 C) (Oral)   Resp 20   SpO2 98%   Physical Exam  Constitutional: He appears well-developed and well-nourished. No distress.  HENT:  Head: Normocephalic and atraumatic.  Eyes: Conjunctivae  are normal.  Neck: Normal range of motion.  Cardiovascular: Normal rate, regular rhythm and intact distal pulses.   Capillary refill < 3 sec  Pulmonary/Chest: Effort normal and breath sounds normal.  Musculoskeletal: He exhibits tenderness. He exhibits no edema.       Left knee: He exhibits decreased range of motion, swelling and effusion. He exhibits no ecchymosis, no deformity, no laceration and no erythema. Tenderness (generalized) found.  Verlon Au was significant effusion. No overlying erythema or significant  increased warmth. Moderately decreased range of motion due to pain.  Neurological: He is alert. Coordination normal.  Sensation intact to bilateral lower extremities Strength 5/5 in the bilateral lower extremities  Skin: Skin is warm and dry. He is not diaphoretic.  No tenting of the skin  Psychiatric: He has a normal mood and affect.  Nursing note and vitals reviewed.    ED Treatments / Results  Labs (all labs ordered are listed, but only abnormal results are displayed) Labs Reviewed  SYNOVIAL CELL COUNT + DIFF, W/ CRYSTALS - Abnormal; Notable for the following:       Result Value   Appearance-Synovial TURBID (*)    WBC, Synovial 59,220 (*)    Neutrophil, Synovial 98 (*)    Monocyte-Macrophage-Synovial Fluid 2 (*)    All other components within normal limits  BODY FLUID CULTURE  CULTURE, BLOOD (ROUTINE X 2)  CULTURE, BLOOD (ROUTINE X 2)  CBC WITH DIFFERENTIAL/PLATELET  BASIC METABOLIC PANEL  SEDIMENTATION RATE  URINALYSIS, ROUTINE W REFLEX MICROSCOPIC  I-STAT CG4 LACTIC ACID, ED  GC/CHLAMYDIA PROBE AMP ([place]) NOT AT [facility]    Procedures .Joint Aspiration/Arthrocentesis Date/Time: 02/16/2017 5:45 AM Performed by: Dierdre Forth Authorized by: Dierdre Forth   Consent:    Consent obtained:  Verbal   Consent given by:  Patient   Risks discussed:  Bleeding, infection, incomplete drainage, nerve damage and pain   Alternatives discussed:  No treatment Location:    Location:  Knee   Knee:  L knee Anesthesia (see MAR for exact dosages):    Anesthesia method:  Local infiltration   Local anesthetic:  Lidocaine 1% w/o epi (3ml) Procedure details:    Needle gauge:  18 G   Ultrasound guidance: yes     Approach:  Lateral   Aspirate amount:  35ml   Aspirate characteristics:  Cloudy and serous   Steroid injected: no     Specimen collected: yes   Post-procedure details:    Dressing:  Gauze roll   Patient tolerance of procedure:  Tolerated well, no immediate  complications   (including critical care time)  Medications Ordered in ED Medications  lidocaine (PF) (XYLOCAINE) 1 % injection 5 mL (not administered)  vancomycin (VANCOCIN) IVPB 1000 mg/200 mL premix (not administered)  cefTRIAXone (ROCEPHIN) 1 g in dextrose 5 % 50 mL IVPB (not administered)  oxyCODONE-acetaminophen (PERCOCET/ROXICET) 5-325 MG per tablet 2 tablet (2 tablets Oral Given 02/16/17 0508)  lidocaine (XYLOCAINE) 1 % (with pres) injection (20 mLs  Given 02/16/17 0552)     Initial Impression / Assessment and Plan / ED Course  I have reviewed the triage vital signs and the nursing notes.  Pertinent labs & imaging results that were available during my care of the patient were reviewed by me and considered in my medical decision making (see chart for details).     History presents with Polly articular pain consistent with his gout pain however left knee with somewhat decreased range of motion and significant joint effusion. Joint aspiration with  cloudy fluid. He is low risk for septic joint however has low-grade fever here in the emergency department.   7:37 AM Synovial fluid with increased white blood cells and 98% neutrophils concerning for infection. He does have monosodium urate crystals however with low-grade fever concern persists. Blood cultures, urine culture, lab work obtained. Patient will need admission for IV antibiotics.  The patient was discussed with and seen by Dr. Nicanor AlconPalumbo who agrees with the treatment plan.  At shift change care was transferred to Dr. Eudelia Bunchardama who will follow pending studies, re-evaulate and admit.     Final Clinical Impressions(s) / ED Diagnoses   Final diagnoses:  Gouty arthritis  Polyarthralgia    New Prescriptions New Prescriptions   No medications on file     Milta DeitersMuthersbaugh, Tamara [place], PA-C 02/16/17 0744    Palumbo, April, MD 02/16/17 [phone]

## 2017-02-16 NOTE — ED Triage Notes (Signed)
Pt complains of gout pain in his left knee, left elbow, and left foot Pt was seen here Monday for the same and the medication isn't working

## 2017-02-16 NOTE — ED Notes (Signed)
Pt ambulatory and independent at discharge.  Verbalized understanding of discharge instructions 

## 2017-02-17 LAB — GC/CHLAMYDIA PROBE AMP (~~LOC~~) NOT AT ARMC
Chlamydia: NEGATIVE
Neisseria Gonorrhea: NEGATIVE

## 2017-02-19 LAB — BODY FLUID CULTURE: Culture: NO GROWTH

## 2017-02-21 LAB — CULTURE, BLOOD (ROUTINE X 2)
Culture: NO GROWTH
Culture: NO GROWTH
Special Requests: ADEQUATE
Special Requests: ADEQUATE

## 2018-06-01 ENCOUNTER — Other Ambulatory Visit: Payer: Self-pay

## 2018-06-01 ENCOUNTER — Emergency Department (HOSPITAL_COMMUNITY): Payer: BLUE CROSS/BLUE SHIELD

## 2018-06-01 ENCOUNTER — Emergency Department (HOSPITAL_COMMUNITY)
Admission: EM | Admit: 2018-06-01 | Discharge: 2018-06-01 | Disposition: A | Discharge status: Home / Self Care | Payer: BLUE CROSS/BLUE SHIELD | Attending: Emergency Medicine | Admitting: Emergency Medicine

## 2018-06-01 ENCOUNTER — Encounter (HOSPITAL_COMMUNITY): Payer: Self-pay | Admitting: Emergency Medicine

## 2018-06-01 DIAGNOSIS — Z79899 Other long term (current) drug therapy: Secondary | ICD-10-CM | POA: Insufficient documentation

## 2018-06-01 DIAGNOSIS — I300 Acute nonspecific idiopathic pericarditis: Secondary | ICD-10-CM | POA: Diagnosis not present

## 2018-06-01 DIAGNOSIS — F17210 Nicotine dependence, cigarettes, uncomplicated: Secondary | ICD-10-CM | POA: Insufficient documentation

## 2018-06-01 DIAGNOSIS — R0789 Other chest pain: Secondary | ICD-10-CM | POA: Diagnosis present

## 2018-06-01 LAB — CBC WITH DIFFERENTIAL/PLATELET
Abs Immature Granulocytes: 0.02 10*3/uL (ref 0.00–0.07)
Basophils Absolute: 0.1 10*3/uL (ref 0.0–0.1)
Basophils Relative: 1 %
Eosinophils Absolute: 0.2 10*3/uL (ref 0.0–0.5)
Eosinophils Relative: 2 %
HCT: 51.5 % (ref 39.0–52.0)
Hemoglobin: 16.8 g/dL (ref 13.0–17.0)
Immature Granulocytes: 0 %
Lymphocytes Relative: 34 %
Lymphs Abs: 2.9 10*3/uL (ref 0.7–4.0)
MCH: 28.8 pg (ref 26.0–34.0)
MCHC: 32.6 g/dL (ref 30.0–36.0)
MCV: 88.2 fL (ref 80.0–100.0)
Monocytes Absolute: 0.9 10*3/uL (ref 0.1–1.0)
Monocytes Relative: 10 %
Neutro Abs: 4.3 10*3/uL (ref 1.7–7.7)
Neutrophils Relative %: 53 %
Platelets: 333 10*3/uL (ref 150–400)
RBC: 5.84 MIL/uL — ABNORMAL HIGH (ref 4.22–5.81)
RDW: 14.9 % (ref 11.5–15.5)
WBC: 8.3 10*3/uL (ref 4.0–10.5)
nRBC: 0 % (ref 0.0–0.2)

## 2018-06-01 LAB — BASIC METABOLIC PANEL
Anion gap: 8 (ref 5–15)
BUN: 13 mg/dL (ref 6–20)
CO2: 26 mmol/L (ref 22–32)
Calcium: 10.1 mg/dL (ref 8.9–10.3)
Chloride: 104 mmol/L (ref 98–111)
Creatinine, Ser: 1.31 mg/dL — ABNORMAL HIGH (ref 0.61–1.24)
GFR calc Af Amer: >60 mL/min (ref >60)
GFR calc non Af Amer: >60 mL/min (ref >60)
Glucose, Bld: 103 mg/dL — ABNORMAL HIGH (ref 70–99)
Potassium: 3.8 mmol/L (ref 3.5–5.1)
Sodium: 138 mmol/L (ref 135–145)

## 2018-06-01 LAB — D-DIMER, QUANTITATIVE: D-Dimer, Quant: 0.34 ug/mL-FEU (ref 0.00–0.50)

## 2018-06-01 LAB — I-STAT TROPONIN, ED: Troponin i, poc: 0 ng/mL (ref 0.00–0.08)

## 2018-06-01 MED ORDER — ONDANSETRON HCL 4 MG/2ML IJ SOLN
4.0000 mg | Freq: Once | INTRAMUSCULAR | Status: AC
  Administered 2018-06-01: 4 mg via INTRAVENOUS
  Filled 2018-06-01: qty 2

## 2018-06-01 MED ORDER — IBUPROFEN 400 MG PO TABS: ORAL_TABLET | ORAL | 0 refills | Status: DC

## 2018-06-01 MED ORDER — FENTANYL CITRATE (PF) 100 MCG/2ML IJ SOLN
100.0000 ug | Freq: Once | INTRAMUSCULAR | Status: AC
  Administered 2018-06-01: 100 ug via INTRAVENOUS
  Filled 2018-06-01: qty 2

## 2018-06-01 MED ORDER — COLCHICINE 0.6 MG PO TABS: ORAL_TABLET | ORAL | 0 refills | Status: DC

## 2018-06-01 MED ORDER — KETOROLAC TROMETHAMINE 15 MG/ML IJ SOLN
15.0000 mg | Freq: Once | INTRAMUSCULAR | Status: AC
  Administered 2018-06-01: 15 mg via INTRAVENOUS
  Filled 2018-06-01: qty 1

## 2018-06-01 NOTE — ED Provider Notes (Signed)
[facility]-EMERGENCY DEPT Provider Note: Patrick Dell, MD, FACEP  CSN: [id number] MRN: [id number] ARRIVAL: 06/01/18 at 0510 ROOM: WA17/WA17   CHIEF COMPLAINT  Chest Pain   HISTORY OF PRESENT ILLNESS  06/01/18 5:31 AM Patrick Hodges is a 47 y.o. male with about 2-1/2 days of chest pain.  The chest pain is present across his anterior chest.  He describes the pain as dull in nature and rates it as an 8 out of 10.  The pain is worse with lying supine, taking deep breaths or with movement.  The pain is improved when sitting upright.  He denies associated shortness of breath, nausea, vomiting or diaphoresis.  He denies recent chest injury.  He denies lower extremity pain or swelling  Past Medical History:  Diagnosis Date  . Gout     Past Surgical History:  Procedure Laterality Date  . TONSILLECTOMY      History reviewed. No pertinent family history.  Social History   Tobacco Use  . Smoking status: Current Every Day Smoker    Types: Cigarettes  . Smokeless tobacco: Never Used  Substance Use Topics  . Alcohol use: Yes    Comment: occasion  . Drug use: No    Prior to Admission medications   Medication Sig Start Date End Date Taking? Authorizing Provider  indomethacin (INDOCIN) 50 MG capsule Take 1 capsule (50 mg total) by mouth 2 (two) times daily with a meal. 02/14/17   Damian Leavell, [place], NP  moxifloxacin (AVELOX) 400 MG tablet Take 1 tablet (400 mg total) by mouth daily at 8 pm. Patient not taking: Reported on 02/16/2017 10/28/15   Phillis Haggis, MD  naproxen sodium (ANAPROX) 220 MG tablet Take 880-1,100 mg by mouth 2 (two) times daily as needed (pain).    [provider]  oxyCODONE-acetaminophen (PERCOCET/ROXICET) 5-325 MG tablet Take 1-2 tablets by mouth every 6 (six) hours as needed for severe pain. Patient not taking: Reported on 02/16/2017 10/28/15   Phillis Haggis, MD    Allergies Patient has no known allergies.   REVIEW OF SYSTEMS  Negative except as noted here  or in the History of Present Illness.   PHYSICAL EXAMINATION  Initial Vital Signs Blood pressure 135/71, pulse (!) 106, temperature 99 F (37.2 C), temperature source Oral, resp. rate 19, height 6' (1.829 m), weight 113.4 kg, SpO2 97 %.  Examination General: Well-developed, well-nourished male in no acute distress; appearance consistent with age of record HENT: normocephalic; atraumatic Eyes: pupils equal, round and reactive to light; extraocular muscles intact Neck: supple Heart: regular rate and rhythm; tachycardia Lungs: Distant sounds Chest: Nontender Abdomen: soft; nondistended; nontender; bowel sounds present Extremities: No deformity; full range of motion; pulses normal Neurologic: Awake, alert and oriented; motor function intact in all extremities and symmetric; no facial droop Skin: Warm and dry Psychiatric: Normal mood and affect   RESULTS  Summary of this visit's results, reviewed by myself:   EKG Interpretation  Date/Time:  Thursday June 01 2018 05:20:21 EST Ventricular Rate:  106 PR Interval:    QRS Duration: 91 QT Interval:  315 QTC Calculation: 419 R Axis:   -151 Text Interpretation:  Sinus tachycardia Right axis deviation Abnormal R-wave progression, late transition No significant change was found Confirmed by Jiovanni Heeter, Jonny Ruiz ([id number]) on 06/01/2018 5:23:30 AM      Laboratory Studies: Results for orders placed or performed during the hospital encounter of 06/01/18 (from the past 24 hour(s))  Basic metabolic panel     Status: Abnormal  Collection Time: 06/01/18  5:28 AM  Result Value Ref Range   Sodium 138 135 - 145 mmol/L   Potassium 3.8 3.5 - 5.1 mmol/L   Chloride 104 98 - 111 mmol/L   CO2 26 22 - 32 mmol/L   Glucose, Bld 103 (H) 70 - 99 mg/dL   BUN 13 6 - 20 mg/dL   Creatinine, Ser 9.14 (H) 0.61 - 1.24 mg/dL   Calcium 78.2 8.9 - 95.6 mg/dL   GFR calc non Af Amer >60 >60 mL/min   GFR calc Af Amer >60 >60 mL/min   Anion gap 8 5 - 15  CBC with  Differential/Platelet     Status: Abnormal   Collection Time: 06/01/18  5:29 AM  Result Value Ref Range   WBC 8.3 4.0 - 10.5 K/uL   RBC 5.84 (H) 4.22 - 5.81 MIL/uL   Hemoglobin 16.8 13.0 - 17.0 g/dL   HCT 21.3 08.6 - 57.8 %   MCV 88.2 80.0 - 100.0 fL   MCH 28.8 26.0 - 34.0 pg   MCHC 32.6 30.0 - 36.0 g/dL   RDW 46.9 62.9 - 52.8 %   Platelets 333 150 - 400 K/uL   nRBC 0.0 0.0 - 0.2 %   Neutrophils Relative % 53 %   Neutro Abs 4.3 1.7 - 7.7 K/uL   Lymphocytes Relative 34 %   Lymphs Abs 2.9 0.7 - 4.0 K/uL   Monocytes Relative 10 %   Monocytes Absolute 0.9 0.1 - 1.0 K/uL   Eosinophils Relative 2 %   Eosinophils Absolute 0.2 0.0 - 0.5 K/uL   Basophils Relative 1 %   Basophils Absolute 0.1 0.0 - 0.1 K/uL   Immature Granulocytes 0 %   Abs Immature Granulocytes 0.02 0.00 - 0.07 K/uL  D-dimer, quantitative (not at [facility])     Status: None   Collection Time: 06/01/18  5:38 AM  Result Value Ref Range   D-Dimer, Quant 0.34 0.00 - 0.50 ug/mL-FEU  I-stat troponin, ED     Status: None   Collection Time: 06/01/18  5:42 AM  Result Value Ref Range   Troponin i, poc 0.00 0.00 - 0.08 ng/mL   Comment 3           Imaging Studies: Dg Chest 2 View  Result Date: 06/01/2018 CLINICAL DATA:  Chest pain EXAM: CHEST - 2 VIEW COMPARISON:  Chest x-ray dated 10/28/2015 FINDINGS: Heart size and mediastinal contours are stable. Lungs are clear. No pleural effusion or pneumothorax seen. Osseous structures about the chest are unremarkable. IMPRESSION: No active cardiopulmonary disease. No evidence of pneumonia or pulmonary edema. Electronically Signed   By: Bary Richard M.D.   On: 06/01/2018 07:01    ED COURSE and MDM  Nursing notes and initial vitals signs, including pulse oximetry, reviewed.  Vitals:   06/01/18 0526 06/01/18 0600 06/01/18 0630 06/01/18 0700  BP:  (!) 136/98 (!) 140/112 (!) 133/96  Pulse:  (!) 109 (!) 108 90  Resp:  13 (!) 23 15  Temp:      TempSrc:      SpO2:  93% 95% 93%  Weight:  113.4 kg     Height: 6' (1.829 m)      7:30 AM Patient's presentation is consistent with acute pericarditis.  His EKG is not consistent with pericarditis but this is true at about 40% of cases.  We will treated with colchicine 0.6 mg twice daily and ibuprofen 400 mg 4 times a day.  We will refer to cardiology  for follow-up as recommended by Dr. Elease HashimotoNahser of cardiology.  PROCEDURES    ED DIAGNOSES     ICD-10-CM   1. Acute idiopathic pericarditis I30.0        Korvin Valentine, Jonny RuizJohn, MD 06/01/18 [phone]

## 2019-04-02 ENCOUNTER — Emergency Department (HOSPITAL_COMMUNITY): Payer: BLUE CROSS/BLUE SHIELD

## 2019-04-02 ENCOUNTER — Other Ambulatory Visit: Payer: Self-pay

## 2019-04-02 ENCOUNTER — Emergency Department (HOSPITAL_COMMUNITY)
Admission: EM | Admit: 2019-04-02 | Discharge: 2019-04-02 | Disposition: A | Discharge status: Home / Self Care | Payer: BLUE CROSS/BLUE SHIELD | Attending: Emergency Medicine | Admitting: Emergency Medicine

## 2019-04-02 DIAGNOSIS — M109 Gout, unspecified: Secondary | ICD-10-CM

## 2019-04-02 DIAGNOSIS — F17210 Nicotine dependence, cigarettes, uncomplicated: Secondary | ICD-10-CM | POA: Insufficient documentation

## 2019-04-02 DIAGNOSIS — R0789 Other chest pain: Secondary | ICD-10-CM

## 2019-04-02 LAB — BASIC METABOLIC PANEL
Anion gap: 16 — ABNORMAL HIGH (ref 5–15)
BUN: 23 mg/dL — ABNORMAL HIGH (ref 6–20)
CO2: 20 mmol/L — ABNORMAL LOW (ref 22–32)
Calcium: 9.5 mg/dL (ref 8.9–10.3)
Chloride: 102 mmol/L (ref 98–111)
Creatinine, Ser: 1.57 mg/dL — ABNORMAL HIGH (ref 0.61–1.24)
GFR calc Af Amer: 59 mL/min — ABNORMAL LOW (ref >60)
GFR calc non Af Amer: 51 mL/min — ABNORMAL LOW (ref >60)
Glucose, Bld: 119 mg/dL — ABNORMAL HIGH (ref 70–99)
Potassium: 3.7 mmol/L (ref 3.5–5.1)
Sodium: 138 mmol/L (ref 135–145)

## 2019-04-02 LAB — CBC
HCT: 45.6 % (ref 39.0–52.0)
Hemoglobin: 15 g/dL (ref 13.0–17.0)
MCH: 30.1 pg (ref 26.0–34.0)
MCHC: 32.9 g/dL (ref 30.0–36.0)
MCV: 91.4 fL (ref 80.0–100.0)
Platelets: 369 10*3/uL (ref 150–400)
RBC: 4.99 MIL/uL (ref 4.22–5.81)
RDW: 14.3 % (ref 11.5–15.5)
WBC: 9.1 10*3/uL (ref 4.0–10.5)
nRBC: 0 % (ref 0.0–0.2)

## 2019-04-02 LAB — TROPONIN I (HIGH SENSITIVITY)
Troponin I (High Sensitivity): 3 ng/L (ref <18)
Troponin I (High Sensitivity): 3 ng/L (ref <18)

## 2019-04-02 LAB — D-DIMER, QUANTITATIVE: D-Dimer, Quant: 0.78 ug/mL-FEU — ABNORMAL HIGH (ref 0.00–0.50)

## 2019-04-02 MED ORDER — IBUPROFEN 600 MG PO TABS: 600.0000 mg | ORAL_TABLET | Freq: Four times a day (QID) | ORAL | 0 refills | Status: DC | PRN

## 2019-04-02 MED ORDER — SODIUM CHLORIDE (PF) 0.9 % IJ SOLN
INTRAMUSCULAR | Status: AC
  Filled 2019-04-02: qty 50

## 2019-04-02 MED ORDER — HYDROCODONE-ACETAMINOPHEN 5-325 MG PO TABS
1.0000 | ORAL_TABLET | Freq: Once | ORAL | Status: AC
  Administered 2019-04-02: 1 via ORAL
  Filled 2019-04-02: qty 1

## 2019-04-02 MED ORDER — IOHEXOL 350 MG/ML SOLN
100.0000 mL | Freq: Once | INTRAVENOUS | Status: AC | PRN
  Administered 2019-04-02: 100 mL via INTRAVENOUS

## 2019-04-02 MED ORDER — SODIUM CHLORIDE 0.9% FLUSH: 3.0000 mL | Freq: Once | INTRAVENOUS | Status: DC

## 2019-04-02 MED ORDER — INDOMETHACIN 50 MG PO CAPS: 50.0000 mg | ORAL_CAPSULE | Freq: Three times a day (TID) | ORAL | 1 refills | Status: AC

## 2019-04-02 MED ORDER — COLCHICINE 0.6 MG PO TABS
1.2000 mg | ORAL_TABLET | Freq: Once | ORAL | Status: AC
  Administered 2019-04-02: 1.2 mg via ORAL
  Filled 2019-04-02: qty 2

## 2019-04-02 NOTE — ED Notes (Signed)
Spoke with lab to add on d-dimer

## 2019-04-02 NOTE — ED Triage Notes (Signed)
Patient is complaining of right chest pain. Patient states that he is in extreme pain. Patient is also stating he is having a gout flare up.

## 2019-04-02 NOTE — Discharge Instructions (Signed)
Take anti-inflammatories as prescribed for your chest pain as well as your knee pain.  Follow-up with your primary doctor.  Return to the ED develop new or worsening symptoms.

## 2019-04-02 NOTE — ED Provider Notes (Signed)
[facility]-EMERGENCY DEPT Provider Note   CSN: [id number] Arrival date & time: 04/02/19  0100     History   Chief Complaint Chief Complaint  Patient presents with  . Chest Pain  . Gout    HPI Patrick Hodges is a 48 y.o. male.     Patient with history of obesity, gout presenting with a 3-day history of right-sided chest pain.  He reports the pain is ongoing and constant.  It is worse when he bends forward and is worse when he lies flat.  He has some shortness of breath with the pain as well.  Denies any cough or fever.  Pain is similar to when he was diagnosed with pericarditis last year.  He is been taking Tylenol at home without relief.  Denies any cardiac history.  No abdominal pain, nausea or vomiting.  Also complains of ongoing left knee pain similar to previous episodes of gout.  Denies any fevers or chills.  He has difficulty flexing and extending the left knee over the past several days as well.  There has been no erythema, weakness, numbness or tingling.  No other joint pain.  The history is provided by the patient.  Chest Pain Associated symptoms: no abdominal pain, no cough, no dizziness, no fever, no headache, no nausea, no shortness of breath, no vomiting and no weakness     Past Medical History:  Diagnosis Date  . Gout     There are no active problems to display for this patient.   Past Surgical History:  Procedure Laterality Date  . TONSILLECTOMY          Home Medications    Prior to Admission medications   Medication Sig Start Date End Date Taking? Authorizing Provider  ibuprofen (ADVIL) 200 MG tablet Take 1,000 mg by mouth every 6 (six) hours as needed for moderate pain.   Yes [provider]  indomethacin (INDOCIN) 50 MG capsule Take 50 mg by mouth 3 (three) times daily as needed for mild pain.  01/09/19  Yes [provider]  naproxen sodium (ALEVE) 220 MG tablet Take 880 mg by mouth daily as needed (pain).    Yes [provider]  colchicine 0.6 MG tablet Take 1 tablet twice daily for 10 days. Patient not taking: Reported on 04/02/2019 06/01/18   Molpus, John, MD  ibuprofen (ADVIL,MOTRIN) 400 MG tablet Take 1 tablet 4 times a day for 10 days. Patient not taking: Reported on 04/02/2019 06/01/18   Molpus, Jonny Ruiz, MD    Family History No family history on file.  Social History Social History   Tobacco Use  . Smoking status: Current Every Day Smoker    Types: Cigarettes  . Smokeless tobacco: Never Used  Substance Use Topics  . Alcohol use: Yes    Comment: occasion  . Drug use: No     Allergies   Patient has no known allergies.   Review of Systems Review of Systems  Constitutional: Negative for activity change, appetite change and fever.  HENT: Negative for congestion and rhinorrhea.   Eyes: Negative for visual disturbance.  Respiratory: Negative for cough, chest tightness and shortness of breath.   Cardiovascular: Positive for chest pain.  Gastrointestinal: Negative for abdominal pain, nausea and vomiting.  Genitourinary: Negative for dysuria and hematuria.  Musculoskeletal: Positive for arthralgias and myalgias.  Skin: Negative for wound.  Neurological: Negative for dizziness, weakness and headaches.    all other systems are negative except as noted in the  HPI and PMH.    Physical Exam Updated Vital Signs BP 124/84 (BP Location: Left Arm)   Pulse 94   Temp 99.3 F (37.4 C) (Oral)   Resp 12   Ht 6' (1.829 m)   Wt 108.9 kg   SpO2 96%   BMI 32.55 kg/m   Physical Exam Vitals signs and nursing note reviewed.  Constitutional:      General: He is not in acute distress.    Appearance: He is well-developed. He is obese.  HENT:     Head: Normocephalic and atraumatic.     Mouth/Throat:     Pharynx: No oropharyngeal exudate.  Eyes:     Conjunctiva/sclera: Conjunctivae normal.     Pupils: Pupils are equal, round, and reactive to light.  Neck:     Musculoskeletal:  Normal range of motion and neck supple.     Comments: No meningismus. Cardiovascular:     Rate and Rhythm: Normal rate and regular rhythm.     Heart sounds: Normal heart sounds. No murmur.  Pulmonary:     Effort: Pulmonary effort is normal. No respiratory distress.     Breath sounds: Normal breath sounds.     Comments: Right-sided chest pain is not reproducible Chest:     Chest wall: No tenderness.  Abdominal:     Palpations: Abdomen is soft.     Tenderness: There is no abdominal tenderness. There is no guarding or rebound.  Musculoskeletal:        General: Swelling and tenderness present.     Comments: There is moderate left knee joint effusion with reduced range of motion.  There is no overlying warmth or erythema.  Intact DP and PT pulses.  Skin:    General: Skin is warm.     Capillary Refill: Capillary refill takes less than 2 seconds.  Neurological:     General: No focal deficit present.     Mental Status: He is alert and oriented to person, place, and time. Mental status is at baseline.     Cranial Nerves: No cranial nerve deficit.     Motor: No abnormal muscle tone.     Coordination: Coordination normal.     Comments: No ataxia on finger to nose bilaterally. No pronator drift. 5/5 strength throughout. CN 2-12 intact.Equal grip strength. Sensation intact.   Psychiatric:        Behavior: Behavior normal.      ED Treatments / Results  Labs (all labs ordered are listed, but only abnormal results are displayed) Labs Reviewed  BASIC METABOLIC PANEL - Abnormal; Notable for the following components:      Result Value   CO2 20 (*)    Glucose, Bld 119 (*)    BUN 23 (*)    Creatinine, Ser 1.57 (*)    GFR calc non Af Amer 51 (*)    GFR calc Af Amer 59 (*)    Anion gap 16 (*)    All other components within normal limits  D-DIMER, QUANTITATIVE (NOT AT [facility]) - Abnormal; Notable for the following components:   D-Dimer, Quant 0.78 (*)    All other components within normal  limits  CBC  TROPONIN I (HIGH SENSITIVITY)  TROPONIN I (HIGH SENSITIVITY)    EKG EKG Interpretation  Date/Time:  Monday April 02 2019 01:25:22 EDT Ventricular Rate:  112 PR Interval:    QRS Duration: 91 QT Interval:  310 QTC Calculation: 424 R Axis:   -96 Text Interpretation:  Sinus tachycardia Left anterior fascicular  block Baseline wander in lead(s) III V4 V5 No significant change was found Confirmed by Glynn Octaveancour, Lealon Vanputten [phone]([id number]) on 04/02/2019 1:52:42 AM   Radiology Dg Chest 2 View  Result Date: 04/02/2019 CLINICAL DATA:  Chest pain. EXAM: CHEST - 2 VIEW COMPARISON:  06/01/2018 FINDINGS: The cardiomediastinal contours are normal. The lungs are clear. Pulmonary vasculature is normal. No consolidation, pleural effusion, or pneumothorax. No acute osseous abnormalities are seen. IMPRESSION: No acute chest finding. Electronically Signed   By: Narda RutherfordMelanie  Sanford M.D.   On: 04/02/2019 01:58    Procedures Procedures (including critical care time)  Medications Ordered in ED Medications  sodium chloride flush (NS) 0.9 % injection 3 mL (3 mLs Intravenous Not Given 04/02/19 0646)  colchicine tablet 1.2 mg (has no administration in time range)  HYDROcodone-acetaminophen (NORCO/VICODIN) 5-325 MG per tablet 1 tablet (1 tablet Oral Given 04/02/19 [id number])     Initial Impression / Assessment and Plan / ED Course  I have reviewed the triage vital signs and the nursing notes.  Pertinent labs & imaging results that were available during my care of the patient were reviewed by me and considered in my medical decision making (see chart for details).      3 days of right-sided chest pain similar to previous episodes of pericarditis.  EKG is sinus tachycardia.  Chest x-ray is negative.  Troponin is negative x2.  Low suspicion for ACS.  Given pleuritic nature of the pain, will add on d-dimer.  Patient to be treated with colchicine and inflammatories.  D-dimer is negative. CTPE studying to be  obtained.  Knee Xray shows effusion without fracture.   Knee ROM limited by pain. Will need to reassess after pain control.  WBC count normal . No fever.  Low suspicion for septic joint.  Favor gout but will need reevaluation.   Care transferred to Dr. Deretha EmoryZackowski at shift change.    Final Clinical Impressions(s) / ED Diagnoses   Final diagnoses:  Atypical chest pain    ED Discharge Orders    None       Ameenah Prosser, Jeannett SeniorStephen, MD 04/02/19 [id number]

## 2019-05-28 ENCOUNTER — Encounter (HOSPITAL_COMMUNITY): Payer: Self-pay

## 2019-05-28 ENCOUNTER — Observation Stay (HOSPITAL_COMMUNITY)
Admission: EM | Admit: 2019-05-28 | Discharge: 2019-05-30 | Disposition: A | Discharge status: Home / Self Care | Payer: BC Managed Care – PPO | Attending: Internal Medicine | Admitting: Internal Medicine

## 2019-05-28 ENCOUNTER — Emergency Department (HOSPITAL_COMMUNITY): Payer: BC Managed Care – PPO

## 2019-05-28 ENCOUNTER — Other Ambulatory Visit: Payer: Self-pay

## 2019-05-28 DIAGNOSIS — M109 Gout, unspecified: Secondary | ICD-10-CM | POA: Insufficient documentation

## 2019-05-28 DIAGNOSIS — Z6832 Body mass index (BMI) 32.0-32.9, adult: Secondary | ICD-10-CM | POA: Diagnosis not present

## 2019-05-28 DIAGNOSIS — M545 Low back pain, unspecified: Secondary | ICD-10-CM | POA: Diagnosis present

## 2019-05-28 DIAGNOSIS — F17210 Nicotine dependence, cigarettes, uncomplicated: Secondary | ICD-10-CM | POA: Diagnosis not present

## 2019-05-28 DIAGNOSIS — L988 Other specified disorders of the skin and subcutaneous tissue: Secondary | ICD-10-CM | POA: Insufficient documentation

## 2019-05-28 DIAGNOSIS — M5136 Other intervertebral disc degeneration, lumbar region: Secondary | ICD-10-CM | POA: Diagnosis not present

## 2019-05-28 DIAGNOSIS — M48061 Spinal stenosis, lumbar region without neurogenic claudication: Secondary | ICD-10-CM | POA: Insufficient documentation

## 2019-05-28 DIAGNOSIS — M5126 Other intervertebral disc displacement, lumbar region: Secondary | ICD-10-CM | POA: Diagnosis not present

## 2019-05-28 DIAGNOSIS — R29898 Other symptoms and signs involving the musculoskeletal system: Secondary | ICD-10-CM

## 2019-05-28 DIAGNOSIS — R531 Weakness: Principal | ICD-10-CM

## 2019-05-28 DIAGNOSIS — M5127 Other intervertebral disc displacement, lumbosacral region: Secondary | ICD-10-CM | POA: Diagnosis not present

## 2019-05-28 DIAGNOSIS — Z79899 Other long term (current) drug therapy: Secondary | ICD-10-CM | POA: Diagnosis not present

## 2019-05-28 DIAGNOSIS — E669 Obesity, unspecified: Secondary | ICD-10-CM | POA: Diagnosis present

## 2019-05-28 DIAGNOSIS — M5442 Lumbago with sciatica, left side: Secondary | ICD-10-CM

## 2019-05-28 LAB — CBC WITH DIFFERENTIAL/PLATELET
Abs Immature Granulocytes: 0.07 10*3/uL (ref 0.00–0.07)
Basophils Absolute: 0.1 10*3/uL (ref 0.0–0.1)
Basophils Relative: 0 %
Eosinophils Absolute: 0 10*3/uL (ref 0.0–0.5)
Eosinophils Relative: 0 %
HCT: 41.4 % (ref 39.0–52.0)
Hemoglobin: 13.5 g/dL (ref 13.0–17.0)
Immature Granulocytes: 1 %
Lymphocytes Relative: 21 %
Lymphs Abs: 2.9 10*3/uL (ref 0.7–4.0)
MCH: 29.7 pg (ref 26.0–34.0)
MCHC: 32.6 g/dL (ref 30.0–36.0)
MCV: 91.2 fL (ref 80.0–100.0)
Monocytes Absolute: 1.5 10*3/uL — ABNORMAL HIGH (ref 0.1–1.0)
Monocytes Relative: 11 %
Neutro Abs: 9.3 10*3/uL — ABNORMAL HIGH (ref 1.7–7.7)
Neutrophils Relative %: 67 %
Platelets: 435 10*3/uL — ABNORMAL HIGH (ref 150–400)
RBC: 4.54 MIL/uL (ref 4.22–5.81)
RDW: 15.8 % — ABNORMAL HIGH (ref 11.5–15.5)
WBC: 13.9 10*3/uL — ABNORMAL HIGH (ref 4.0–10.5)
nRBC: 0 % (ref 0.0–0.2)

## 2019-05-28 LAB — COMPREHENSIVE METABOLIC PANEL
ALT: 23 U/L (ref 0–44)
AST: 21 U/L (ref 15–41)
Albumin: 4.8 g/dL (ref 3.5–5.0)
Alkaline Phosphatase: 85 U/L (ref 38–126)
Anion gap: 12 (ref 5–15)
BUN: 13 mg/dL (ref 6–20)
CO2: 25 mmol/L (ref 22–32)
Calcium: 9.8 mg/dL (ref 8.9–10.3)
Chloride: 100 mmol/L (ref 98–111)
Creatinine, Ser: 1.17 mg/dL (ref 0.61–1.24)
GFR calc Af Amer: >60 mL/min (ref >60)
GFR calc non Af Amer: >60 mL/min (ref >60)
Glucose, Bld: 110 mg/dL — ABNORMAL HIGH (ref 70–99)
Potassium: 3.6 mmol/L (ref 3.5–5.1)
Sodium: 137 mmol/L (ref 135–145)
Total Bilirubin: 1.1 mg/dL (ref 0.3–1.2)
Total Protein: 9 g/dL — ABNORMAL HIGH (ref 6.5–8.1)

## 2019-05-28 LAB — URINALYSIS, ROUTINE W REFLEX MICROSCOPIC
Bacteria, UA: NONE SEEN
Bilirubin Urine: NEGATIVE
Glucose, UA: NEGATIVE mg/dL
Ketones, ur: NEGATIVE mg/dL
Nitrite: NEGATIVE
Protein, ur: NEGATIVE mg/dL
Specific Gravity, Urine: 1.02 (ref 1.005–1.030)
pH: 5 (ref 5.0–8.0)

## 2019-05-28 LAB — CK: Total CK: 161 U/L (ref 49–397)

## 2019-05-28 LAB — SEDIMENTATION RATE: Sed Rate: 39 mm/hr — ABNORMAL HIGH (ref 0–16)

## 2019-05-28 LAB — C-REACTIVE PROTEIN: CRP: 12.1 mg/dL — ABNORMAL HIGH (ref <1.0)

## 2019-05-28 MED ORDER — ONDANSETRON HCL 4 MG/2ML IJ SOLN
4.0000 mg | Freq: Once | INTRAMUSCULAR | Status: AC
  Administered 2019-05-28: 4 mg via INTRAVENOUS
  Filled 2019-05-28: qty 2

## 2019-05-28 MED ORDER — GADOBUTROL 1 MMOL/ML IV SOLN
10.0000 mL | Freq: Once | INTRAVENOUS | Status: AC | PRN
  Administered 2019-05-28: 10 mL via INTRAVENOUS

## 2019-05-28 MED ORDER — LORAZEPAM 2 MG/ML IJ SOLN
1.0000 mg | Freq: Once | INTRAMUSCULAR | Status: AC
  Administered 2019-05-28: 1 mg via INTRAVENOUS
  Filled 2019-05-28: qty 1

## 2019-05-28 MED ORDER — HYDROMORPHONE HCL 1 MG/ML IJ SOLN
0.5000 mg | Freq: Once | INTRAMUSCULAR | Status: AC
  Administered 2019-05-28: 0.5 mg via INTRAVENOUS
  Filled 2019-05-28: qty 1

## 2019-05-28 MED ORDER — DIAZEPAM 5 MG PO TABS
5.0000 mg | ORAL_TABLET | Freq: Once | ORAL | Status: AC
  Administered 2019-05-29: 5 mg via ORAL
  Filled 2019-05-28: qty 1

## 2019-05-28 MED ORDER — FENTANYL CITRATE (PF) 100 MCG/2ML IJ SOLN
50.0000 ug | Freq: Once | INTRAMUSCULAR | Status: AC
  Administered 2019-05-28: 50 ug via INTRAVENOUS
  Filled 2019-05-28: qty 2

## 2019-05-28 MED ORDER — DEXAMETHASONE SODIUM PHOSPHATE 10 MG/ML IJ SOLN
10.0000 mg | Freq: Once | INTRAMUSCULAR | Status: AC
  Administered 2019-05-29: 10 mg via INTRAVENOUS
  Filled 2019-05-28: qty 1

## 2019-05-28 MED ORDER — KETOROLAC TROMETHAMINE 15 MG/ML IJ SOLN
15.0000 mg | Freq: Once | INTRAMUSCULAR | Status: AC
  Administered 2019-05-29: 15 mg via INTRAVENOUS
  Filled 2019-05-28: qty 1

## 2019-05-28 NOTE — ED Notes (Signed)
Pt returned from MRI. MRI advised pt could not continue his MRI, so only thoracic scan complete. No lumbar scan or contrast administered.

## 2019-05-28 NOTE — ED Provider Notes (Signed)
Medical screening examination/treatment/procedure(s) were conducted as a shared visit with non-physician practitioner(s) and myself.  I personally evaluated the patient during the encounter. Briefly, the patient is a 48 y.o. male with history of back pain/gout who presents the ED with bilateral leg weakness since this morning.  Patient with overall unremarkable vitals.  States that he has noticed increasing weakness in his legs today.  Has had some left-sided sciatic pain on and off recently but that is fairly chronic for him.  He typically does not usually have weakness of his legs.  He appears to have weakness and mostly leg extension and flexion at the hip.  This is bilateral.  Has good pulses bilaterally.  Good sensation throughout his lower extremities.  Reflexes are difficult to elicit throughout.  Has 5+ out of 5 strength in his upper extremities.  He appears to have 5+ out of 5 strength in dorsiflexion and plantarflexion.  Denies any midline spinal tenderness.  Denies any IV drug use.  Denies any recent fever, chills.  Have talked to neurology and will get MRIs of the thoracic and lumbar spine for further evaluation.  Concern for myositis versus Guillain-Barr or infection within the spine.  Seems less likely functional at this time.  Lab work shows mild elevation in sed rate, CRP, white count.  Otherwise CKs unremarkable.  No significant anemia, electrolyte abnormality.  Awaiting MRI and will reach out to neurology again.  Suspect he may need LP as concern for possible Guillain-Barr syndrome or some variant.   EKG Interpretation None       \   Lennice Sites, DO 05/28/19 1622

## 2019-05-28 NOTE — ED Triage Notes (Signed)
Per EMS- Patient reported that he has had back pain that radiates sown the left leg, bilateral leg weakness x 1 days.

## 2019-05-28 NOTE — ED Provider Notes (Signed)
[facility]-EMERGENCY DEPT Provider Note   CSN: [id number] Arrival date & time: 05/28/19  1103     History   Chief Complaint Chief Complaint  Patient presents with  . Back Pain  . Extremity Weakness    HPI Patrick Hodges is a 48 y.o. male.  Who presents emergency department chief complaint of leg weakness.  Patient has a past medical history of obesity, daily smoking, and gouty arthritis primarily affecting the knees.  He also has a history of recurrent, self-limited episodes of left-sided low back pain and sciatica rating down the back of the left leg.  3 days ago the patient began having pain in his back radiating through the left buttock and posterior left thigh.  He states that it was worse with  HPI  Past Medical History:  Diagnosis Date  . Gout     There are no active problems to display for this patient.   Past Surgical History:  Procedure Laterality Date  . TONSILLECTOMY          Home Medications    Prior to Admission medications   Medication Sig Start Date End Date Taking? Authorizing Provider  colchicine 0.6 MG tablet Take 1 tablet twice daily for 10 days. Patient not taking: Reported on 04/02/2019 06/01/18   Molpus, Jonny Ruiz, MD  naproxen sodium (ALEVE) 220 MG tablet Take 880 mg by mouth daily as needed (pain).    [provider]    Family History History reviewed. No pertinent family history.  Social History Social History   Tobacco Use  . Smoking status: Current Every Day Smoker    Types: Cigarettes  . Smokeless tobacco: Never Used  Substance Use Topics  . Alcohol use: Yes    Comment: occasion  . Drug use: No     Allergies   Patient has no known allergies.   Review of Systems Review of Systems Ten systems reviewed and are negative for acute change, except as noted in the HPI. \  Physical Exam Updated Vital Signs BP (!) 145/95   Pulse 92   Temp 99.6 F (37.6 C) (Oral)   Resp 16   Ht 6' (1.829 m)   Wt  111.6 kg   SpO2 100%   BMI 33.36 kg/m   Physical Exam Vitals signs and nursing note reviewed.  Constitutional:      General: He is not in acute distress.    Appearance: He is well-developed. He is not diaphoretic.  HENT:     Head: Normocephalic and atraumatic.  Eyes:     General: No scleral icterus.    Conjunctiva/sclera: Conjunctivae normal.  Neck:     Musculoskeletal: Normal range of motion and neck supple.  Cardiovascular:     Rate and Rhythm: Normal rate and regular rhythm.     Heart sounds: Normal heart sounds.  Pulmonary:     Effort: Pulmonary effort is normal. No respiratory distress.     Breath sounds: Normal breath sounds.  Abdominal:     Palpations: Abdomen is soft.     Tenderness: There is no abdominal tenderness.  Skin:    General: Skin is warm and dry.  Neurological:     Mental Status: He is alert.     Comments: Normal sensation in the upper and lower extremities. 5 out of 5 strength at the ankles bilaterally with dorsi and plantar flexion. 4 out of 5 strength with flexion, extension ab/adduction of the hips Unable to elicit reflexes Patient unable to stand secondary  to proximal muscle strength weakness  Psychiatric:        Behavior: Behavior normal.      ED Treatments / Results  Labs (all labs ordered are listed, but only abnormal results are displayed) Labs Reviewed - No data to display  EKG None  Radiology No results found.  Procedures Procedures (including critical care time)  Medications Ordered in ED Medications - No data to display   Initial Impression / Assessment and Plan / ED Course  I have reviewed the triage vital signs and the nursing notes.  Pertinent labs & imaging results that were available during my care of the patient were reviewed by me and considered in my medical decision making (see chart for details).        48 year old male with a history of sciatica who presents with bilateral upper extremity proximal muscle  being him better than proximal muscle strength. Differential diagnosis includes Guillain-Barr although this is less likely secondary to lack of sensory involvement and distal extremity strength is better than the proximal muscle strength.  Differential also includes compressive myelopathy, again less likely secondary to areflexia, dermatomyositis, polymyalgia rheumatica although given the patient's age this would also be rare in his demographic. I discussed the case with Dr. Leonel Ramsay who recommends thoracic and lumbar MRI with and without contrast.  Results are currently pending.  Labs show urine without signs of infection.  Elevated sed rate and c-reactive protein, patient white blood cell count elevated at 13.9 I have given signout to Dr. Kathrynn Humble who will assume care of the patient  Final Clinical Impressions(s) / ED Diagnoses   Final diagnoses:  None    ED Discharge Orders    None       Margarita Mail, PA-C 05/28/19 2316    Lennice Sites, DO 05/29/19 [phone]

## 2019-05-29 ENCOUNTER — Encounter (HOSPITAL_COMMUNITY): Payer: Self-pay | Admitting: Internal Medicine

## 2019-05-29 DIAGNOSIS — R29898 Other symptoms and signs involving the musculoskeletal system: Secondary | ICD-10-CM

## 2019-05-29 DIAGNOSIS — R531 Weakness: Secondary | ICD-10-CM

## 2019-05-29 DIAGNOSIS — M545 Low back pain, unspecified: Secondary | ICD-10-CM | POA: Diagnosis present

## 2019-05-29 DIAGNOSIS — M5442 Lumbago with sciatica, left side: Secondary | ICD-10-CM

## 2019-05-29 LAB — BASIC METABOLIC PANEL
Anion gap: 9 (ref 5–15)
BUN: 17 mg/dL (ref 6–20)
CO2: 25 mmol/L (ref 22–32)
Calcium: 9.2 mg/dL (ref 8.9–10.3)
Chloride: 101 mmol/L (ref 98–111)
Creatinine, Ser: 1.3 mg/dL — ABNORMAL HIGH (ref 0.61–1.24)
GFR calc Af Amer: >60 mL/min (ref >60)
GFR calc non Af Amer: >60 mL/min (ref >60)
Glucose, Bld: 340 mg/dL — ABNORMAL HIGH (ref 70–99)
Potassium: 4.1 mmol/L (ref 3.5–5.1)
Sodium: 135 mmol/L (ref 135–145)

## 2019-05-29 LAB — CBC
HCT: 43.1 % (ref 39.0–52.0)
Hemoglobin: 14 g/dL (ref 13.0–17.0)
MCH: 29.2 pg (ref 26.0–34.0)
MCHC: 32.5 g/dL (ref 30.0–36.0)
MCV: 90 fL (ref 80.0–100.0)
Platelets: 301 10*3/uL (ref 150–400)
RBC: 4.79 MIL/uL (ref 4.22–5.81)
RDW: 15.4 % (ref 11.5–15.5)
WBC: 9.3 10*3/uL (ref 4.0–10.5)
nRBC: 0 % (ref 0.0–0.2)

## 2019-05-29 LAB — HIV ANTIBODY (ROUTINE TESTING W REFLEX): HIV Screen 4th Generation wRfx: NONREACTIVE

## 2019-05-29 LAB — HEMOGLOBIN A1C
Hgb A1c MFr Bld: 5.8 % — ABNORMAL HIGH (ref 4.8–5.6)
Mean Plasma Glucose: 119.76 mg/dL

## 2019-05-29 LAB — GLUCOSE, CAPILLARY
Glucose-Capillary: 115 mg/dL — ABNORMAL HIGH (ref 70–99)
Glucose-Capillary: 161 mg/dL — ABNORMAL HIGH (ref 70–99)

## 2019-05-29 LAB — SARS CORONAVIRUS 2 (TAT 6-24 HRS): SARS Coronavirus 2: NEGATIVE

## 2019-05-29 MED ORDER — METHYLPREDNISOLONE 4 MG PO TBPK: 8.0000 mg | ORAL_TABLET | Freq: Every morning | ORAL | Status: DC

## 2019-05-29 MED ORDER — NICOTINE 14 MG/24HR TD PT24
14.0000 mg | MEDICATED_PATCH | Freq: Every day | TRANSDERMAL | Status: DC
  Administered 2019-05-29 – 2019-05-30 (×2): 14 mg via TRANSDERMAL
  Filled 2019-05-29 (×2): qty 1

## 2019-05-29 MED ORDER — METHYLPREDNISOLONE 4 MG PO TBPK
4.0000 mg | ORAL_TABLET | ORAL | Status: AC
  Administered 2019-05-29: 4 mg via ORAL
  Filled 2019-05-29: qty 21

## 2019-05-29 MED ORDER — INSULIN ASPART 100 UNIT/ML ~~LOC~~ SOLN
0.0000 [IU] | Freq: Three times a day (TID) | SUBCUTANEOUS | Status: DC
  Administered 2019-05-30: 2 [IU] via SUBCUTANEOUS
  Filled 2019-05-29: qty 0.15

## 2019-05-29 MED ORDER — METHYLPREDNISOLONE 4 MG PO TBPK: 8.0000 mg | ORAL_TABLET | Freq: Every evening | ORAL | Status: DC

## 2019-05-29 MED ORDER — ALLOPURINOL 300 MG PO TABS
150.0000 mg | ORAL_TABLET | Freq: Every day | ORAL | Status: DC
  Administered 2019-05-29 – 2019-05-30 (×2): 150 mg via ORAL
  Filled 2019-05-29 (×2): qty 1
  Filled 2019-05-29: qty 0.5

## 2019-05-29 MED ORDER — METHYLPREDNISOLONE 4 MG PO TBPK: 4.0000 mg | ORAL_TABLET | Freq: Four times a day (QID) | ORAL | Status: DC

## 2019-05-29 MED ORDER — ONDANSETRON HCL 4 MG PO TABS: 4.0000 mg | ORAL_TABLET | Freq: Four times a day (QID) | ORAL | Status: DC | PRN

## 2019-05-29 MED ORDER — INSULIN ASPART 100 UNIT/ML ~~LOC~~ SOLN
0.0000 [IU] | Freq: Every day | SUBCUTANEOUS | Status: DC
  Filled 2019-05-29: qty 0.05

## 2019-05-29 MED ORDER — ONDANSETRON HCL 4 MG/2ML IJ SOLN: 4.0000 mg | Freq: Four times a day (QID) | INTRAMUSCULAR | Status: DC | PRN

## 2019-05-29 MED ORDER — ACETAMINOPHEN 650 MG RE SUPP: 650.0000 mg | Freq: Four times a day (QID) | RECTAL | Status: DC | PRN

## 2019-05-29 MED ORDER — METHYLPREDNISOLONE 4 MG PO TBPK: 4.0000 mg | ORAL_TABLET | ORAL | Status: DC

## 2019-05-29 MED ORDER — METHYLPREDNISOLONE 4 MG PO TBPK
4.0000 mg | ORAL_TABLET | Freq: Three times a day (TID) | ORAL | Status: DC
  Administered 2019-05-30 (×2): 4 mg via ORAL

## 2019-05-29 MED ORDER — ACETAMINOPHEN 325 MG PO TABS
650.0000 mg | ORAL_TABLET | Freq: Four times a day (QID) | ORAL | Status: DC | PRN
  Administered 2019-05-30: 650 mg via ORAL
  Filled 2019-05-29: qty 2

## 2019-05-29 MED ORDER — METHYLPREDNISOLONE 4 MG PO TBPK
8.0000 mg | ORAL_TABLET | Freq: Every evening | ORAL | Status: AC
  Administered 2019-05-29: 8 mg via ORAL

## 2019-05-29 MED ORDER — SODIUM CHLORIDE 0.9 % IV SOLN
INTRAVENOUS | Status: AC
  Administered 2019-05-29 (×2): via INTRAVENOUS

## 2019-05-29 NOTE — Consult Note (Signed)
Neurology Consultation  Reason for Consult: Bilateral leg weakness, left back pain, left posterior proximal leg pain Referring Physician: Hal Hope,  CC: Left leg pain and bilateral leg weakness  History is obtained from: Patient  HPI: Patrick Hodges is a 48 y.o. male with past medical history only of gout.  Patient states that Friday he was baseline.  On Saturday he noted that he had back pain in his lower back that extended down to the posterior aspect of his thigh.  He describes it as aching-"like sciatica "however he does not state that he noticed a electrical shock going down past his knee in the posterior region.  He was able to walk with no assistance.  Sunday patient noticed more pain in that same distribution, a feeling of "pinching in the left aspect of his lower back ".  At this point he states it was affecting his right leg but he felt it was due to him favoring the right leg over the left and the way he was walking.  On Monday patient woke up at approximately 4 AM and could not support himself as he had no strength in bilateral legs and stayed on the floor.  It was at that time that he was brought to the hospital.  On consultation he states he is actually feeling stronger in his left leg but needs to hold onto anything to support himself but able to walk which he was unable to do yesterday.   ED course MRI of the lumbar spine, thoracic spine, consult with teleneurology along with labs   Work up that has been done: MRI lumbar spine, thoracic spine and labs     Past Medical History:  Diagnosis Date  . Gout     Family History  Problem Relation Age of Onset  . Diabetes Mellitus II Neg Hx    Social History:   reports that he has been smoking cigarettes. He has never used smokeless tobacco. He reports current alcohol use. He reports that he does not use drugs.  Medications  Current Facility-Administered Medications:  .  0.9 %  sodium chloride infusion, , Intravenous,  Continuous, Rise Patience, MD, Last Rate: 100 mL/hr at 05/29/19 0559 .  acetaminophen (TYLENOL) tablet 650 mg, 650 mg, Oral, Q6H PRN **OR** acetaminophen (TYLENOL) suppository 650 mg, 650 mg, Rectal, Q6H PRN, Rise Patience, MD .  allopurinol (ZYLOPRIM) tablet 150 mg, 150 mg, Oral, Daily, Rise Patience, MD .  ondansetron [facility]) tablet 4 mg, 4 mg, Oral, Q6H PRN **OR** ondansetron (ZOFRAN) injection 4 mg, 4 mg, Intravenous, Q6H PRN, Rise Patience, MD  Current Outpatient Medications:  .  allopurinol (ZYLOPRIM) 300 MG tablet, Take 150 mg by mouth daily., Disp: , Rfl:  .  naproxen sodium (ALEVE) 220 MG tablet, Take 880 mg by mouth daily as needed (pain)., Disp: , Rfl:  .  colchicine 0.6 MG tablet, Take 1 tablet twice daily for 10 days. (Patient not taking: Reported on 04/02/2019), Disp: 20 tablet, Rfl: 0   Exam: Current vital signs: BP 122/68   Pulse 90   Temp 100.3 F (37.9 C) (Oral)   Resp 16   Ht 6' (1.829 m)   Wt 111.6 kg   SpO2 95%   BMI 33.36 kg/m  Vital signs in last 24 hours: Temp:  [99.6 F (37.6 C)-100.3 F (37.9 C)] 100.3 F (37.9 C) (11/24 0232) Pulse Rate:  [90-118] 90 (11/24 0800) Resp:  [16-21] 16 (11/24 0800) BP: (120-145)/(63-95) 122/68 (11/24 0800) SpO2:  [  93 %-100 %] 95 % (11/24 0800) Weight:  [111.6 kg] 111.6 kg (11/23 1129)  ROS:    General ROS: negative for - chills, fatigue, fever, night sweats, weight gain or weight loss Psychological ROS: negative for - behavioral disorder, hallucinations, memory difficulties, mood swings or suicidal ideation Ophthalmic ROS: negative for - blurry vision, double vision, eye pain or loss of vision ENT ROS: negative for - epistaxis, nasal discharge, oral lesions, sore throat, tinnitus or vertigo Respiratory ROS: negative for - cough, hemoptysis, shortness of breath or wheezing Cardiovascular ROS: negative for - chest pain, dyspnea on exertion, edema or irregular heartbeat Gastrointestinal ROS:  negative for - abdominal pain, diarrhea, hematemesis, nausea/vomiting or stool incontinence Genito-Urinary ROS: negative for - dysuria, hematuria, incontinence or urinary frequency/urgency Musculoskeletal ROS: Positive for -muscular weakness and pain Neurological ROS: as noted in HPI Dermatological ROS: negative for rash and skin lesion changes   Physical Exam   Constitutional: Appears well-developed and well-nourished.  Psych: Affect appropriate to situation Eyes: No scleral injection HENT: No OP obstrucion Head: Normocephalic.  Cardiovascular: Normal rate and regular rhythm.  Respiratory: Effort normal, non-labored breathing GI: Soft.  No distension. There is no tenderness.  Skin: WDI  Neuro: Mental Status: Patient is awake, alert, oriented to person, place, month, year, and situation. Patient is able to give a clear and coherent history. No signs of aphasia or neglect Cranial Nerves: II: Visual Fields are full.  III,IV, VI: EOMI without ptosis or diploplia. Pupils equal, round and reactive to light V: Facial sensation is symmetric to temperature VII: Facial movement is symmetric.  VIII: hearing is intact to voice X: Palat elevates symmetrically XI: Shoulder shrug is symmetric. XII: tongue is midline without atrophy or fasciculations.  Motor: Tone is normal. Bulk is normal. 5/5 strength was present in all four extremities.  With upper extremity bicep testing he does show tremor-like activity but states this is normal for him.  Left knee extension was also slightly weaker of 4/5 again he states this is normal for him Sensory: Sensation is symmetric to light touch and temperature in the arms and legs.  Patient shows good proprioception, vibratory sensation bilateral feet ankles.  No pain when pushing on left region to test for sciatica.  No pain with pressure to his lower back. Deep Tendon Reflexes: 2+ and symmetric in the biceps and patellae.  Plantars: Toes are downgoing  bilaterally.  Cerebellar: FNF and HKS are intact on the right leg however had difficulty with left leg but did not show any ataxia Gait: Able to stand, clearly favors right leg, short steps with multiple step turn secondary to discomfort.  Is able to slightly bend knees but due to discomfort and weakness only for a second and needed to sit down.  Patient was holding onto bed while he was walking during this exam   Labs I have reviewed labs in epic and the results pertinent to this consultation are:   CBC    Component Value Date/Time   WBC 9.3 05/29/2019 0559   RBC 4.79 05/29/2019 0559   HGB 14.0 05/29/2019 0559   HCT 43.1 05/29/2019 0559   PLT 301 05/29/2019 0559   MCV 90.0 05/29/2019 0559   MCH 29.2 05/29/2019 0559   MCHC 32.5 05/29/2019 0559   RDW 15.4 05/29/2019 0559   LYMPHSABS 2.9 05/28/2019 1500   MONOABS 1.5 (H) 05/28/2019 1500   EOSABS 0.0 05/28/2019 1500   BASOSABS 0.1 05/28/2019 1500    CMP     Component  Value Date/Time   NA 135 05/29/2019 0559   K 4.1 05/29/2019 0559   CL 101 05/29/2019 0559   CO2 25 05/29/2019 0559   GLUCOSE 340 (H) 05/29/2019 0559   BUN 17 05/29/2019 0559   CREATININE 1.30 (H) 05/29/2019 0559   CALCIUM 9.2 05/29/2019 0559   PROT 9.0 (H) 05/28/2019 1500   ALBUMIN 4.8 05/28/2019 1500   AST 21 05/28/2019 1500   ALT 23 05/28/2019 1500   ALKPHOS 85 05/28/2019 1500   BILITOT 1.1 05/28/2019 1500   GFRNONAA >60 05/29/2019 0559   GFRAA >60 05/29/2019 0559    Lipid Panel  No results found for: CHOL, TRIG, HDL, CHOLHDL, VLDL, LDLCALC, LDLDIRECT   Imaging I have reviewed the images obtained:   MRI examination of thoracic spine and lumbar spine: No acute findings.  Intermediate size left subarticular/foraminal protrusion with mild left foraminal stenosis.  There is narrowing of the left lateral recess with mild displacement of descending left L5 nerve root.  L5-S1 small left subarticular disc protrusion annular fissure in close proximity to  the descending left S1 nerve root.  Right T1-2" uncovertebral spurring contributing to mild foraminal narrowing  Etta Quill PA-C [facility] [phone]  M-F  (9:00 am- 5:00 PM)  05/29/2019, 8:44 AM     Assessment: There is a narrowing of the L4-5 descending L5 nerve root and I do not find any neurological decreased strength, reflexes fully intact along with sensation that does not fit any specific neurological distributions.  Given these findings and his symptoms are getting better I do believe that the L4-5 disc trusion is likely causing most of his weakness and pain.  However, this would not contribute to bilateral inability to stand or walk.  Etiology for this is unknown, as stated by patient may be due to pain.  Although his CRP and ESR are elevated this likely is secondary to inflammation.  As his CK is not elevated I do not think this is a form of polymyositis.    Impression: Primarily hip/back/leg discomfort and pain with exam showing no weakness. I suspect that this is more musculoskeletal, though acute disc protrusion could be playing a role.   Recommendations: -Physical therapy -As he is improved to the point where he can walk, discussed with primary team that at this point he does not need to be transferred to [facility] -will cancel C-spine MRI as does not appear to be a cervical spine issue  -Outpatient EMG/nerve conduction study in 2 to 4 weeks could be considered.  - consider medrol dose pack  Roland Rack, MD [facility] [phone]  If 7pm- 7am, please page neurology on call as listed in [place].

## 2019-05-29 NOTE — Progress Notes (Signed)
TOC CM referral: pt will be admitted, Unit RN will follow up on dc needs. Pt is noted to have insurance with [employer], and PCP. [place], [place] ED TOC CM [phone]

## 2019-05-29 NOTE — Evaluation (Signed)
Physical Therapy Evaluation Patient Details Name: Patrick Hodges MRN: [id number] DOB: 1971/03/02 Today's Date: 05/29/2019   History of Present Illness  Patrick Hodges is a 48 y.o. male with history of gout who had recently presented to the ER on April 02, 2019 with gout-like symptoms.  Pt now admitted wiith bil LE weakness.  MRI of T-spine and L-spine shows mild neural foraminal narrowing and disc protrusion per MD notes.  Initial plan was for MRI of C spine and transfer to  [facility] per neurology; however, pt's weakness is improving, MRI and transfer cancelled and PT/OT ordered.  Neurology recommending EMG/nerve conduction as outpatient if needed.  Per neurology, "L4-5 disc protrusion is likely causing most of his weakness and pain.  However, this would not contribute to bilateral inability to stand or walk. Etiology for this is unknown"  Clinical Impression  Pt admitted with above diagnosis.  Pt was able to demonstrate gait and transfers safely with compensation techniques and DME. His pain and weakness have improved since admission.  Pain is in L SI/buttock/hip with weight bearing, active abduction but not passive, and most severe with advancing LE with gait.  Pain was not affected by any back movements other than rotation to the R in which pt felt a stretch. As pain is improving, showed pt compensation techniques and recommend f/u with outpt PT if not further improved.  However, if pt still in hospital will benefit from PT while here.   Pt currently with functional limitations due to the deficits listed below (see PT Problem List). Pt will benefit from skilled PT to increase their independence and safety with mobility to allow discharge to the venue listed below.       Follow Up Recommendations Outpatient PT    Equipment Recommendations  Rolling walker with 5" wheels    Recommendations for Other Services       Precautions / Restrictions Precautions Precautions:  Fall Restrictions Weight Bearing Restrictions: No      Mobility  Bed Mobility Overal bed mobility: Needs Assistance Bed Mobility: Supine to Sit;Sit to Supine     Supine to sit: Supervision Sit to supine: Modified independent (Device/Increase time)   General bed mobility comments: demonstrated how to use gait belt to assist L LE with transfer  Transfers Overall transfer level: Needs assistance Equipment used: Rolling walker (2 wheeled) Transfers: Sit to/from Stand Sit to Stand: Min guard            Ambulation/Gait Ambulation/Gait assistance: Supervision;Min guard Gait Distance (Feet): 20 Feet Assistive device: Rolling walker (2 wheeled);None   Gait velocity: decreased   General Gait Details: started min guard, progressed to supervision; with RW pt demonstrating step-through gait with limited deficits; He demonstrated a few steps without RW with trendelenberg L hip and L knee instability and decreased stance time on L; additionally difficulty and increased time advancing L LE  Stairs            Wheelchair Mobility    Modified Rankin (Stroke Patients Only)       Balance Overall balance assessment: Needs assistance Sitting-balance support: No upper extremity supported;Feet unsupported Sitting balance-Leahy Scale: Normal     Standing balance support: No upper extremity supported;During functional activity Standing balance-Leahy Scale: Good                               Pertinent Vitals/Pain Pain Assessment: 0-10 Pain Score: 0-No pain(0/10 rest; 4/10 weight bearing;  8/10 advancing L leg forward) Pain Location: L SI/hip/buttock Pain Descriptors / Indicators: Discomfort;Sharp;Guarding Pain Intervention(s): Limited activity within patient's tolerance;Repositioned    Home Living Family/patient expects to be discharged to:: Private residence   Available Help at Discharge: Family;Available PRN/intermittently Type of Home: Apartment Home  Access: Stairs to enter Entrance Stairs-Rails: Right;Left;Can reach both Entrance Stairs-Number of Steps: 16 Home Layout: One level Home Equipment: None      Prior Function Level of Independence: Independent         Comments: Pt completely independent until pain increased on Monday morning.     Hand Dominance        Extremity/Trunk Assessment   Upper Extremity Assessment Upper Extremity Assessment: Overall WFL for tasks assessed    Lower Extremity Assessment Lower Extremity Assessment: RLE deficits/detail;LLE deficits/detail RLE Deficits / Details: 5/5 throughout; ROM WFL LLE Deficits / Details: MMT 5/5 knee and ankle; 4/5 hip - limited by pain; ROM WFL    Cervical / Trunk Assessment Cervical / Trunk Assessment: Normal  Communication   Communication: No difficulties  Cognition Arousal/Alertness: Awake/alert Behavior During Therapy: WFL for tasks assessed/performed Overall Cognitive Status: Within Functional Limits for tasks assessed                                        General Comments General comments (skin integrity, edema, etc.):  Back Screening:  Repeated extension and flexion in seated and standing position no effect on pain.  Lateral trunk lean and rotation to L no effect.  R rotation slight increase in pain/stretch. Knees: Pt with c/o generalized knee soreness from edema. L hip: Pain increased with active abduction but not passive abduction.  No pain with heel slides/hip flexion in supine, but painful swing phase gait.  No pain with IR/ER or piriformis stretch.  Most significant pain with swing phase gait.     Exercises     Assessment/Plan    PT Assessment Patient needs continued PT services  PT Problem List Decreased strength;Decreased mobility;Decreased range of motion;Decreased activity tolerance;Decreased balance;Decreased knowledge of use of DME       PT Treatment Interventions DME instruction;Therapeutic activities;Gait  training;Therapeutic exercise;Patient/family education;Modalities;Stair training;Functional mobility training;Manual techniques    PT Goals (Current goals can be found in the Care Plan section)  Acute Rehab PT Goals Patient Stated Goal: return home by noon tomorrow PT Goal Formulation: With patient/family Time For Goal Achievement: 06/12/19 Potential to Achieve Goals: Good Additional Goals Additional Goal #1: Pain in L hip/buttock to 3/10 with walking for improved walking and less use of RW    Frequency Min 3X/week   Barriers to discharge Inaccessible home environment      Co-evaluation               AM-PAC PT "6 Clicks" Mobility  Outcome Measure Help needed turning from your back to your side while in a flat bed without using bedrails?: None Help needed moving from lying on your back to sitting on the side of a flat bed without using bedrails?: None Help needed moving to and from a bed to a chair (including a wheelchair)?: None Help needed standing up from a chair using your arms (e.g., wheelchair or bedside chair)?: None Help needed to walk in hospital room?: None Help needed climbing 3-5 steps with a railing? : A Little 6 Click Score: 23    End of Session Equipment Utilized During Treatment:  Gait belt Activity Tolerance: Patient tolerated treatment well Patient left: in bed;with call bell/phone within reach;with family/visitor present(bed alarm not needed -low fall risk, safe with RW) Nurse Communication: Mobility status PT Visit Diagnosis: Other abnormalities of gait and mobility (R26.89);Muscle weakness (generalized) (M62.81)    Time: 1710-1735 PT Time Calculation (min) (ACUTE ONLY): 25 min   Charges:   PT Evaluation $PT Eval Low Complexity: 1 Low          Royetta Asalacia Kymere Fullington, PT Acute Rehab Services Pager [phone] [facility] [phone] [facility] [phone]   Rayetta HumphreyDacia H Kenry Daubert 05/29/2019, 5:53 PM

## 2019-05-29 NOTE — Progress Notes (Signed)
Inpatient Diabetes Program Recommendations  AACE/ADA: New Consensus Statement on Inpatient Glycemic Control (2015)  Target Ranges:  Prepandial:   less than 140 mg/dL      Peak postprandial:   less than 180 mg/dL (1-2 hours)      Critically ill patients:  140 - 180 mg/dL   Results for DICKEY, CAAMANO (MRN [id number]) as of 05/29/2019 10:26  Ref. Range 05/28/2019 15:00 05/29/2019 05:59  Glucose Latest Ref Range: 70 - 99 mg/dL 110 (H) 340 (H)   Review of Glycemic Control  Diabetes history: None  Current orders for Inpatient glycemic control: none  Received Decadron 10 mg  Inpatient Diabetes Program Recommendations:    Consider CBGs and Novolog 0-15 units tid + hs scale if needed.  Thanks,  Tama Headings RN, MSN, BC-ADM Inpatient Diabetes Coordinator Team Pager [phone] (8a-5p)

## 2019-05-29 NOTE — Consult Note (Signed)
[facility] Tele[facility]urology Consult Services   Date of Service:   05/29/2019 00:38:13  Impression:     .  Bilateral leg weakness  Comments/Sign-Out: Patient with bilateral leg weakness and reported to have no reflexes per ED MD as unable to elicit myself. MRI thoracic and lumbar spine did not reveal and etiology. His CK is normal. Differential would include C spine lesion or demyelinating process. Would evaluate for C spine lesion with MRI C spine. If negative then would consider an atypical Raynald Blend and get a lumbar puncture.  Metrics: Last Known Well: 05/27/2019 21:30:00 [facility] Notification Time: 05/29/2019 00:37:30 Arrival Time: 05/28/2019 11:10:00 Stamp Time: 05/29/2019 00:38:13 Time First Login Attempt: 05/29/2019 00:42:14 Video Start Time: 05/29/2019 00:42:14  Symptoms: bilateral leg weakness NIHSS Start Assessment Time: 05/29/2019 00:43:48 Patient is not a candidate for Alteplase/Activase. Video End Time: 05/29/2019 00:57:32  CT head was not reviewed.  Clinical Presentation is not Suggestive of Large Vessel Occlusive Disease  ED Physician notified of diagnostic impression and management plan on 05/29/2019 00:57:34  Our recommendations are outlined below.  Recommendations:     .  Activate Stroke Protocol Admission/Order Set     .  Stroke/Telemetry Floor     .  Neuro Checks     .  Bedside Swallow Eval     .  DVT Prophylaxis     .  IV Fluids, Normal Saline     .  Head of Bed 30 Degrees     .  Euglycemia and Avoid Hyperthermia (PRN Acetaminophen)     .  MRI C spine     .  If negative would get LP  Routine Consultation with [facility] for Follow up Care  Sign Out:     .  Discussed with Emergency Department Provider    ------------------------------------------------------------------------------  History of Present Illness: Patient is a 48 year old Male.  Patient was brought by private transportation with symptoms of  bilateral leg weakness  48 yo M with history of gout who is presenting with weakness in his legs. He says he was walking around normal yesterday and when he woke up at 4:00 he had no strength in his legs. He was not able to walk. He was las normal at 21:30 last night. Denies any bowel or bladder issues. He has some back pain on the right side. He has had it before but it's not consistent. It's 4 days. CK 161   Past Medical History:     . There is NO history of Hypertension     . There is NO history of Diabetes Mellitus     . There is NO history of Hyperlipidemia     . There is NO history of Stroke  Anticoagulant use:  No  Antiplatelet use: No  Examination: BP(143/86), Pulse(118), Blood Glucose(110) 1A: Level of Consciousness - Alert; keenly responsive + 0 1B: Ask Month and Age - Both Questions Right + 0 1C: Blink Eyes & Squeeze Hands - Performs Both Tasks + 0 2: Test Horizontal Extraocular Movements - Normal + 0 3: Test Visual Fields - No Visual Loss + 0 4: Test Facial Palsy (Use Grimace if Obtunded) - Normal symmetry + 0 5A: Test Left Arm Motor Drift - No Drift for 10 Seconds + 0 5B: Test Right Arm Motor Drift - No Drift for 10 Seconds + 0 6A: Test Left Leg Motor Drift - Some Effort Against Gravity + 2 6B: Test Right Leg Motor Drift - Some Effort Against Gravity +  2 7: Test Limb Ataxia (FNF/Heel-Shin) - No Ataxia + 0 8: Test Sensation - Normal; No sensory loss + 0 9: Test Language/Aphasia - Normal; No aphasia + 0 10: Test Dysarthria - Normal + 0 11: Test Extinction/Inattention - No abnormality + 0  NIHSS Score: 4  Pre-Morbid Modified Ranking Scale: 0 Points = No symptoms at all   Patient/Family was informed the Neurology Consult would happen via TeleHealth consult by way of interactive audio and video telecommunications and consented to receiving care in this manner.   Due to the immediate potential for life-threatening deterioration due to underlying acute neurologic  illness, I spent 25 minutes providing critical care. This time includes time for face to face visit via telemedicine, review of medical records, imaging studies and discussion of findings with providers, the patient and/or family.   Dr Ginette Pitman   [facility] [phone]   Case [id number]

## 2019-05-29 NOTE — H&P (Addendum)
History and Physical    Patrick Hodges MWN:[id number] DOB: [birth year] DOA: 05/28/2019  PCP: Kristen Loader, FNP  Patient coming from: Home.  Chief Complaint: Weakness of the both lower extremities.  HPI: Patrick Hodges is a 48 y.o. male with history of gout who had recently presented to the ER on April 02, 2019 with gout-like symptoms and also chest pain with history of pericarditis diagnosed a year ago was placed on colchicine for 14 days following which his symptoms have improved presents to the ER with complaint of weakness of both lower extremity over the last 2 days with low back pain.  Denies any trauma fall.  Denies any incontinence of urine or bowel.  Has not had any subjective feeling of fever chills chest pain neck pain or shortness of breath.  ED Course: In the ER patient is mildly febrile.  Patient had MRI of the T-spine and L-spine with and without contrast which shows mild neural foraminal narrowing and disc protrusion.  Not much accounting for the weakness.  Tele neurologist was consulted and at this time recommended to get MRI C-spine with and without contrast and if negative for anything acute to get a lumbar puncture.  COVID-19 test is pending.  CRP level is elevated at around 12.1 and on exam patient is able to move his extremities but appears weak on both lower extremities.  Deep tendon reflexes are difficult to elicit and appears to be absent.  Patient has good sensation and denies any paresthesias.  Labs reveal creatinine of 1.1 CK 161 WBC 13.9 hemoglobin 13.5 platelets 435 UA unremarkable sed rate 39.  Review of Systems: As per HPI, rest all negative.   Past Medical History:  Diagnosis Date   Gout     Past Surgical History:  Procedure Laterality Date   TONSILLECTOMY       reports that he has been smoking cigarettes. He has never used smokeless tobacco. He reports current alcohol use. He reports that he does not use drugs.  No Known Allergies  Family  History  Problem Relation Age of Onset   Diabetes Mellitus II Neg Hx     Prior to Admission medications   Medication Sig Start Date End Date Taking? Authorizing Provider  allopurinol (ZYLOPRIM) 300 MG tablet Take 150 mg by mouth daily. 05/09/19  Yes [provider]  naproxen sodium (ALEVE) 220 MG tablet Take 880 mg by mouth daily as needed (pain).   Yes [provider]  colchicine 0.6 MG tablet Take 1 tablet twice daily for 10 days. Patient not taking: Reported on 04/02/2019 06/01/18   Molpus, Jenny Reichmann, MD    Physical Exam: Constitutional: Moderately built and nourished. Vitals:   05/28/19 1129 05/28/19 2309 05/29/19 0232 05/29/19 0330  BP:  134/86 135/81 133/90  Pulse:  (!) 118 (!) 115 (!) 108  Resp:  17 20 (!) 21  Temp:   100.3 F (37.9 C)   TempSrc:   Oral   SpO2:  100% 94% 93%  Weight: 111.6 kg     Height: 6' (1.829 m)      Eyes: Anicteric no pallor. ENMT: No discharge from the ears eyes nose or mouth. Neck: No mass felt.  No neck rigidity. Respiratory: No rhonchi or crepitations. Cardiovascular: S1-S2 heard. Abdomen: Soft nontender bowel sounds present. Musculoskeletal: No edema.  No obvious joint effusion but recent x-ray did show some effusion. Skin: No rash. Neurologic: Alert awake oriented to time place and person.  Has weakness of the  both lower extremity about 3 x 5 in strength with both upper extremities 5 x 5 in strength.  No facial asymmetry tongue is midline. Psychiatric: Appears normal.   Labs on Admission: I have personally reviewed following labs and imaging studies  CBC: Recent Labs  Lab 05/28/19 1500  WBC 13.9*  NEUTROABS 9.3*  HGB 13.5  HCT 41.4  MCV 91.2  PLT 435*   Basic Metabolic Panel: Recent Labs  Lab 05/28/19 1500  NA 137  K 3.6  CL 100  CO2 25  GLUCOSE 110*  BUN 13  CREATININE 1.17  CALCIUM 9.8   GFR: Estimated Creatinine Clearance: 98.5 mL/min (by C-G formula based on SCr of 1.17 mg/dL). Liver Function  Tests: Recent Labs  Lab 05/28/19 1500  AST 21  ALT 23  ALKPHOS 85  BILITOT 1.1  PROT 9.0*  ALBUMIN 4.8   No results for input(s): LIPASE, AMYLASE in the last 168 hours. No results for input(s): AMMONIA in the last 168 hours. Coagulation Profile: No results for input(s): INR, PROTIME in the last 168 hours. Cardiac Enzymes: Recent Labs  Lab 05/28/19 1500  CKTOTAL 161   BNP (last 3 results) No results for input(s): PROBNP in the last 8760 hours. HbA1C: No results for input(s): HGBA1C in the last 72 hours. CBG: No results for input(s): GLUCAP in the last 168 hours. Lipid Profile: No results for input(s): CHOL, HDL, LDLCALC, TRIG, CHOLHDL, LDLDIRECT in the last 72 hours. Thyroid Function Tests: No results for input(s): TSH, T4TOTAL, FREET4, T3FREE, THYROIDAB in the last 72 hours. Anemia Panel: No results for input(s): VITAMINB12, FOLATE, FERRITIN, TIBC, IRON, RETICCTPCT in the last 72 hours. Urine analysis:    Component Value Date/Time   COLORURINE YELLOW 05/28/2019 1732   APPEARANCEUR CLEAR 05/28/2019 1732   LABSPEC 1.020 05/28/2019 1732   PHURINE 5.0 05/28/2019 1732   GLUCOSEU NEGATIVE 05/28/2019 1732   HGBUR SMALL (A) 05/28/2019 1732   BILIRUBINUR NEGATIVE 05/28/2019 1732   KETONESUR NEGATIVE 05/28/2019 1732   PROTEINUR NEGATIVE 05/28/2019 1732   UROBILINOGEN 1.0 10/16/2007 2052   NITRITE NEGATIVE 05/28/2019 1732   LEUKOCYTESUR SMALL (A) 05/28/2019 1732   Sepsis Labs: @LABRCNTIP (procalcitonin:4,lacticidven:4) )No results found for this or any previous visit (from the past 240 hour(s)).   Radiological Exams on Admission: Mr Thoracic Spine Wo Contrast  Result Date: 05/28/2019 CLINICAL DATA:  Generalize lower extremity weakness. EXAM: MRI THORACIC SPINE WITHOUT CONTRAST TECHNIQUE: Multiplanar, multisequence MR imaging of the thoracic spine was performed. No intravenous contrast was administered. COMPARISON:  CT chest 04/02/2019 FINDINGS: Alignment:  Normal  alignment. The patient refused to complete the study. Sagittal T1 and T2 and STIR images were obtained. No axial images. The patient refused intravenous contrast. The lumbar spine was not performed. Vertebrae: Normal bone marrow.  Negative for fracture or mass. Cord:  Normal spinal cord.  No cord lesion or cord compression. Paraspinal and other soft tissues: Negative Disc levels: Mild disc degeneration and spurring on the right at T1-2 which is not appear to be causing significant stenosis. Remaining disc spaces are normal. No significant spinal stenosis IMPRESSION: Limited study. The patient was not able to complete the study. No contrast or axial images obtained. Lumbar spine study was not performed. Mild spurring on the right at T1-2. Otherwise negative MRI thoracic spine. Electronically Signed   By: 04/04/2019 M.D.   On: 05/28/2019 18:45   Mr Thoracic Spine W Wo Contrast  Result Date: 05/28/2019 CLINICAL DATA:  Proximal bilateral lower extremity weakness. EXAM: MRI  THORACIC AND LUMBAR SPINE WITHOUT AND WITH CONTRAST TECHNIQUE: Multiplanar and multiecho pulse sequences of the thoracic and lumbar spine were obtained without and with intravenous contrast. CONTRAST:  10mL GADAVIST GADOBUTROL 1 MMOL/ML IV SOLN COMPARISON:  None. FINDINGS: MRI THORACIC SPINE FINDINGS The study is degraded by motion. Alignment:  Physiologic. Vertebrae: No fracture, evidence of discitis, or bone lesion. Cord:  Normal signal and morphology. Paraspinal and other soft tissues: Negative. Disc levels: No disc herniation or stenosis. There is a small osseous spur on the right at the T1-2 level. MRI LUMBAR SPINE FINDINGS Segmentation:  Standard. Alignment:  Physiologic. Vertebrae:  No fracture, evidence of discitis, or bone lesion. Conus medullaris: Extends to the L1-2 level and appears normal. Paraspinal and other soft tissues: Negative Disc levels: The lumbar levels above L4-5 are normal. L4-5: Intermediate sized left  subarticular/foraminal protrusion with mild left foraminal stenosis. There is narrowing of the left lateral recess with mild displacement of the descending left L5 nerve root. L5-S1: Small left subarticular disc protrusion annular fissure in close proximity to the descending left S1 nerve root. No abnormal contrast enhancement of the thoracic or lumbar spine. IMPRESSION: 1. Motion degraded examination. 2. No acute findings. 3. L4-L5 left subarticular/foraminal disc protrusion with mild left foraminal stenosis and narrowing of the left lateral recess with mild displacement of the descending left L5 nerve root. 4. L5-S1 small left subarticular disc protrusion annular fissure in close proximity to the descending left S1 nerve root. 5. Right T1-2 uncovertebral spurring contributing to mild neural foraminal narrowing. Electronically Signed   By: Deatra RobinsonKevin  Herman M.D.   On: 05/28/2019 21:41   Mr Lumbar Spine W Wo Contrast  Result Date: 05/28/2019 CLINICAL DATA:  Proximal bilateral lower extremity weakness. EXAM: MRI THORACIC AND LUMBAR SPINE WITHOUT AND WITH CONTRAST TECHNIQUE: Multiplanar and multiecho pulse sequences of the thoracic and lumbar spine were obtained without and with intravenous contrast. CONTRAST:  10mL GADAVIST GADOBUTROL 1 MMOL/ML IV SOLN COMPARISON:  None. FINDINGS: MRI THORACIC SPINE FINDINGS The study is degraded by motion. Alignment:  Physiologic. Vertebrae: No fracture, evidence of discitis, or bone lesion. Cord:  Normal signal and morphology. Paraspinal and other soft tissues: Negative. Disc levels: No disc herniation or stenosis. There is a small osseous spur on the right at the T1-2 level. MRI LUMBAR SPINE FINDINGS Segmentation:  Standard. Alignment:  Physiologic. Vertebrae:  No fracture, evidence of discitis, or bone lesion. Conus medullaris: Extends to the L1-2 level and appears normal. Paraspinal and other soft tissues: Negative Disc levels: The lumbar levels above L4-5 are normal. L4-5:  Intermediate sized left subarticular/foraminal protrusion with mild left foraminal stenosis. There is narrowing of the left lateral recess with mild displacement of the descending left L5 nerve root. L5-S1: Small left subarticular disc protrusion annular fissure in close proximity to the descending left S1 nerve root. No abnormal contrast enhancement of the thoracic or lumbar spine. IMPRESSION: 1. Motion degraded examination. 2. No acute findings. 3. L4-L5 left subarticular/foraminal disc protrusion with mild left foraminal stenosis and narrowing of the left lateral recess with mild displacement of the descending left L5 nerve root. 4. L5-S1 small left subarticular disc protrusion annular fissure in close proximity to the descending left S1 nerve root. 5. Right T1-2 uncovertebral spurring contributing to mild neural foraminal narrowing. Electronically Signed   By: Deatra RobinsonKevin  Herman M.D.   On: 05/28/2019 21:41     Assessment/Plan Principal Problem:   Lower extremity weakness Active Problems:   Low back pain  1. Bilateral lower extremity weakness -discussed with on-call neurologist Dr. Laurence Slate.  At this time plan is to get a MRI of the C-spine.  If creatinine is normal will get with contrast if not without contrast of the C-spine.  Following which if it is negative may need lumbar puncture.  Neurologist recommended transfer to [facility]. 2. Mild fever COVID-19 test is pending. 3. History of gout on allopurinol. 4. Tobacco abuse advised about quitting. 5. Recent diagnosis of gout and also previous history of pericarditis.   DVT prophylaxis: SCDs for now. Code Status: Full code. Family Communication: Discussed with patient. Disposition Plan: To be determined. Consults called: Neurologist. Admission status: Observation.   Eduard Clos MD [facility] Pager [phone].  If 7PM-7AM, please contact night-coverage [email or web address] Password [facility]  05/29/2019, 3:53 AM

## 2019-05-29 NOTE — ED Provider Notes (Addendum)
  Physical Exam  BP 134/86 (BP Location: Left Arm)   Pulse (!) 118   Temp 99.6 F (37.6 C) (Oral)   Resp 17   Ht 6' (1.829 m)   Wt 111.6 kg   SpO2 100%   BMI 33.36 kg/m   Physical Exam  ED Course/Procedures     .Critical Care Performed by: Varney Biles, MD Authorized by: Varney Biles, MD   Critical care provider statement:    Critical care time (minutes):  32   Critical care was time spent personally by me on the following activities:  Discussions with consultants, evaluation of patient's response to treatment, examination of patient, ordering and performing treatments and interventions, ordering and review of laboratory studies, ordering and review of radiographic studies, pulse oximetry, re-evaluation of patient's condition, obtaining history from patient or surrogate and review of old charts    MDM   48 year old comes in a chief complaint of back pain and lower extremity weakness. MRI is negative for any acute process. No evidence of transverse myelitis, cord compression.  There is some degenerative spine disease and impingement but does not seem clinically significant from neurosurgical perspective. I discussed the case with neurology they wanted [place] to aggressively treat the pain and see if patient is able to ambulate.  They doubt that this is a Guillain-Barr syndrome.  Patient was given pain meds and reassess.  He still having significant difficulty getting up and walking.  He reports that his weakness is way worse than his pain. At this time will page teleneuro.  It is stable to send him home. PT has also been ordered.  Along with consult for social work/case management..  His care will be followed by the overnight staff.  Teleneuro recommendations would also determine if patient needs admission and further provocative work-up like LP.  If that is the recommendation that he will need admission for sure.   Varney Biles, MD 05/29/19 0050   1:03  AM Teleneurology returned call.  They reported that this saw the patient and they think that patient might need MRI C-spine and perhaps also MRI brain with and without contrast.  If the MRI is negative for any demyelinating process then they would recommend an LP for atypical presentation of Guillain-Barr syndrome.  At this time patient has been in the ER for multiple hours and has undergone sign out through 2 physicians.  His work-up will not be completed until later in the morning.  It is best that he gets admitted to the hospital and gets coordinated work-up completed. Dr. Hal Hope admitting, appreciate their help.    Varney Biles, MD 05/29/19 [phone]

## 2019-05-29 NOTE — Progress Notes (Signed)
Care started prior to midnight in the emergency room yesterday and patient was admitted early this morning after midnight and H&P has been reviewed and I am in current agreement with assessment and plan done by Dr. Gean Birchwood.  Additional changes to the plan of care have been made accordingly.  The patient is a obese African-American male with a past medical history significant for gout as well as other comorbidities including history of chest pain with pericarditis a year ago who presented to the ED with weakness both lower extremities lasted over last 2 days associated with low back pain worse on the left.  Patient denies any trauma or fall denies any incontinence of bowel or stool.  States that he went to bed Sunday and woke up Monday morning with inability to walk.  He was worked up in the emergency room and had MRI of the T-spine and L-spine with and without contrast which showed mild neural foraminal narrowing and disc protrusion.  Telemetry neurology was consulted and they recommended to get an MRI of the C-spine without contrast.  COVID-19 testing was negative.  CRP was elevated 12.1 and patient was weak on examination but was able to walk today.  Neurology evaluated the PA and I discussed the case and they were recommending canceling the C-spine MRI and obtaining PT and OT.  Since patient's weakness is improving and he is up to the point where he can walk with assistance they did not feel that he needed to be transferred to [facility].  Neurology recommends an outpatient EMG nerve conduction study in 2 to 4 weeks and are considering giving the patient a Medrol Dosepak but Dr. Leonel Ramsay is further to evaluate later today.  Patient's bed status was changed to observation telemetry at [facility] long since tested negative and because neurology recommended to transplant transfer.  We will continue monitor patient's clinical response to intervention and repeat blood work in the a.m. and follow-up on  Neurology recommendations and in the interim obtain PT and OT evaluation

## 2019-05-29 NOTE — ED Notes (Signed)
Patient states that he has no pain unless moving in which pain is an 8/10. Patient denies pain when lying still.

## 2019-05-30 DIAGNOSIS — E669 Obesity, unspecified: Secondary | ICD-10-CM | POA: Diagnosis not present

## 2019-05-30 DIAGNOSIS — M5442 Lumbago with sciatica, left side: Secondary | ICD-10-CM | POA: Diagnosis not present

## 2019-05-30 DIAGNOSIS — R531 Weakness: Secondary | ICD-10-CM | POA: Diagnosis not present

## 2019-05-30 LAB — COMPREHENSIVE METABOLIC PANEL
ALT: 36 U/L (ref 0–44)
AST: 27 U/L (ref 15–41)
Albumin: 3.8 g/dL (ref 3.5–5.0)
Alkaline Phosphatase: 61 U/L (ref 38–126)
Anion gap: 7 (ref 5–15)
BUN: 18 mg/dL (ref 6–20)
CO2: 27 mmol/L (ref 22–32)
Calcium: 9.2 mg/dL (ref 8.9–10.3)
Chloride: 104 mmol/L (ref 98–111)
Creatinine, Ser: 1.08 mg/dL (ref 0.61–1.24)
GFR calc Af Amer: >60 mL/min (ref >60)
GFR calc non Af Amer: >60 mL/min (ref >60)
Glucose, Bld: 163 mg/dL — ABNORMAL HIGH (ref 70–99)
Potassium: 4.4 mmol/L (ref 3.5–5.1)
Sodium: 138 mmol/L (ref 135–145)
Total Bilirubin: 0.9 mg/dL (ref 0.3–1.2)
Total Protein: 7.5 g/dL (ref 6.5–8.1)

## 2019-05-30 LAB — CBC WITH DIFFERENTIAL/PLATELET
Abs Immature Granulocytes: 0.08 10*3/uL — ABNORMAL HIGH (ref 0.00–0.07)
Basophils Absolute: 0 10*3/uL (ref 0.0–0.1)
Basophils Relative: 0 %
Eosinophils Absolute: 0 10*3/uL (ref 0.0–0.5)
Eosinophils Relative: 0 %
HCT: 43.4 % (ref 39.0–52.0)
Hemoglobin: 13.8 g/dL (ref 13.0–17.0)
Immature Granulocytes: 1 %
Lymphocytes Relative: 9 %
Lymphs Abs: 1.4 10*3/uL (ref 0.7–4.0)
MCH: 29.4 pg (ref 26.0–34.0)
MCHC: 31.8 g/dL (ref 30.0–36.0)
MCV: 92.3 fL (ref 80.0–100.0)
Monocytes Absolute: 1 10*3/uL (ref 0.1–1.0)
Monocytes Relative: 7 %
Neutro Abs: 12.5 10*3/uL — ABNORMAL HIGH (ref 1.7–7.7)
Neutrophils Relative %: 83 %
Platelets: 317 10*3/uL (ref 150–400)
RBC: 4.7 MIL/uL (ref 4.22–5.81)
RDW: 15.6 % — ABNORMAL HIGH (ref 11.5–15.5)
WBC: 15 10*3/uL — ABNORMAL HIGH (ref 4.0–10.5)
nRBC: 0 % (ref 0.0–0.2)

## 2019-05-30 LAB — PHOSPHORUS: Phosphorus: 3.4 mg/dL (ref 2.5–4.6)

## 2019-05-30 LAB — MAGNESIUM: Magnesium: 2.2 mg/dL (ref 1.7–2.4)

## 2019-05-30 LAB — GLUCOSE, CAPILLARY
Glucose-Capillary: 142 mg/dL — ABNORMAL HIGH (ref 70–99)
Glucose-Capillary: 111 mg/dL — ABNORMAL HIGH (ref 70–99)

## 2019-05-30 MED ORDER — METHYLPREDNISOLONE 4 MG PO TBPK: ORAL_TABLET | ORAL | 0 refills | Status: DC

## 2019-05-30 NOTE — Progress Notes (Signed)
Pt to be discharged to home this afternoon. Pt and Pt's Wife given discharge instructions including Medications and schedules of these Medications. Rolling [place] Rile has been delivered and is with Pt at discharge pt and Pt's Wife verbalized understanding of all discharge teaching/instructions. Discharge packet with Prescription with Pt at time of discharge

## 2019-05-30 NOTE — TOC Transition Note (Signed)
Transition of Care [facility]) - CM/SW Discharge Note   Patient Details  Name: Patrick Hodges MRN: [id number] Date of Birth: Jul 20, 1970  [facility] CM/SW Contact:  Wende Neighbors, LCSW Phone Number: 05/30/2019, 12:15 PM   Clinical Narrative:   Patient needing rolling walker at discharge. CSW ordered walker through [employer] and they will delivered equipment to patients room at discharge. Per RN patients family will transport patient home           Patient Goals and CMS Choice        Discharge Placement                       Discharge Plan and Services                                     Social Determinants of Health (SDOH) Interventions     Readmission Risk Interventions No flowsheet data found.

## 2019-05-30 NOTE — Discharge Summary (Addendum)
Physician Discharge Summary  Patrick Hodges:[id number] DOB: 1971-02-22 DOA: 05/28/2019  PCP: Kristen Loader, FNP  Admit date: 05/28/2019 Discharge date: 05/30/2019  Admitted From: Home Disposition: Home  Recommendations for Outpatient Follow-up:  1. Follow up with PCP in 1 week.  Patient needs outpatient EMG/nerve conduction study in about 3-4 weeks.   Home Health: None, referred for outpatient PT Equipment/Devices: Rolling walker  Discharge Condition: Fair CODE STATUS: Full code Diet recommendation: Regular   Discharge Diagnoses:  Principal Problem:   Lower extremity weakness  Active Problems:   Low back pain   Generalized weakness   Obesity (BMI 30-39.9)  Brief narrative/HPI 48 year old obese male presented to the ED with 2 days history of low back pain with lower extremity weakness.  He was seen in the ED 2 months back with gout-like symptoms and chest pain and was treated with 2 weeks of colchicine with improvement in symptoms. In the ED he had low-grade fever with MRI of the T and L-spine with and without contrast showing mild neural foraminal narrowing with disc protrusion at L4-L5.  Telemetry neurologist was consulted who recommended MRI of the C-spine.  Noted to have weakness in the legs with pain but with good sensation.  His CK was 161, labs unremarkable except for sed rate of 39 and CRP elevated at 12.1.  COVID-19 was tested negative. Placed on observation and neurology consulted  Hospital course Low back pain with lower extremity weakness. Narrowing of the L4-L5 on MRI with weakness likely contributing to his symptoms.  Patient has good sensation and reflexes.  Neurology consult appreciated and given his normal CK suspect this is unlikely polymyositis.  No bowel or urinary symptoms.  His elevated CRP and ESR likely secondary to inflammation.  Patient started on Medrol pack with improvement in his symptoms.  He has been able to walk with some difficulty since  yesterday. Cervical spine MRI not needed.  Outpatient EMG/nerve conduction study in 2-4 weeks recommended. Patient will be discharged on Medrol Dosepak.  Continue Naprosyn for pain. Patient instructed to return to the ED if he has any worsened pain or weakness, bowel or urinary symptoms.  Seen by PT and recommends outpatient therapy.  Rolling walker ordered.  Tobacco use Counseled on cessation  Perineal discharge Patient complains of off-and-on purulent discharge from the perineum.  Reports having some urological procedure several years back.  On exam he has a small opening on the perineum without any discharge, tenderness or bleeding.  May have a sinus track around the area from prior surgery.  Instructed to follow-up with his PCP and may need referral to urology if symptoms persistent.  Does not need antibiotics.  Obesity (BMI 32.89 kg/m) Counseled on weight loss and exercise  Procedure: MRI thoracic and lumbar spine Consults: Neurology Disposition: Home  Discharge Instructions   Allergies as of 05/30/2019   No Known Allergies     Medication List    STOP taking these medications   colchicine 0.6 MG tablet     TAKE these medications   allopurinol 300 MG tablet Commonly known as: ZYLOPRIM Take 150 mg by mouth daily.   methylPREDNISolone 4 MG Tbpk tablet Commonly known as: MEDROL DOSEPAK Take 8 mg twice daily for 2 days, then starting day 3, take 4 mg every 6 hours for total 10 doses, then stop   naproxen sodium 220 MG tablet Commonly known as: ALEVE Take 880 mg by mouth daily as needed (pain).  Durable Medical Equipment  (From admission, onward)         Start     Ordered   05/30/19 1059  For home use only DME Walker rolling  Once    Question:  Patient needs a walker to treat with the following condition  Answer:  Lower extremity weakness   05/30/19 1058         Follow-up Information    Kristen Loader, FNP. Schedule an appointment as soon as  possible for a visit in 1 week(s).   Specialty: Family Medicine Contact information: [place] [address] [phone]          No Known Allergies      Procedures/Studies: Mr Thoracic Spine Wo Contrast  Result Date: 05/28/2019 CLINICAL DATA:  Generalize lower extremity weakness. EXAM: MRI THORACIC SPINE WITHOUT CONTRAST TECHNIQUE: Multiplanar, multisequence MR imaging of the thoracic spine was performed. No intravenous contrast was administered. COMPARISON:  CT chest 04/02/2019 FINDINGS: Alignment:  Normal alignment. The patient refused to complete the study. Sagittal T1 and T2 and STIR images were obtained. No axial images. The patient refused intravenous contrast. The lumbar spine was not performed. Vertebrae: Normal bone marrow.  Negative for fracture or mass. Cord:  Normal spinal cord.  No cord lesion or cord compression. Paraspinal and other soft tissues: Negative Disc levels: Mild disc degeneration and spurring on the right at T1-2 which is not appear to be causing significant stenosis. Remaining disc spaces are normal. No significant spinal stenosis IMPRESSION: Limited study. The patient was not able to complete the study. No contrast or axial images obtained. Lumbar spine study was not performed. Mild spurring on the right at T1-2. Otherwise negative MRI thoracic spine. Electronically Signed   By: Franchot Gallo M.D.   On: 05/28/2019 18:45   Mr Thoracic Spine W Wo Contrast  Result Date: 05/28/2019 CLINICAL DATA:  Proximal bilateral lower extremity weakness. EXAM: MRI THORACIC AND LUMBAR SPINE WITHOUT AND WITH CONTRAST TECHNIQUE: Multiplanar and multiecho pulse sequences of the thoracic and lumbar spine were obtained without and with intravenous contrast. CONTRAST:  28m GADAVIST GADOBUTROL 1 MMOL/ML IV SOLN COMPARISON:  None. FINDINGS: MRI THORACIC SPINE FINDINGS The study is degraded by motion. Alignment:  Physiologic. Vertebrae: No fracture, evidence of  discitis, or bone lesion. Cord:  Normal signal and morphology. Paraspinal and other soft tissues: Negative. Disc levels: No disc herniation or stenosis. There is a small osseous spur on the right at the T1-2 level. MRI LUMBAR SPINE FINDINGS Segmentation:  Standard. Alignment:  Physiologic. Vertebrae:  No fracture, evidence of discitis, or bone lesion. Conus medullaris: Extends to the L1-2 level and appears normal. Paraspinal and other soft tissues: Negative Disc levels: The lumbar levels above L4-5 are normal. L4-5: Intermediate sized left subarticular/foraminal protrusion with mild left foraminal stenosis. There is narrowing of the left lateral recess with mild displacement of the descending left L5 nerve root. L5-S1: Small left subarticular disc protrusion annular fissure in close proximity to the descending left S1 nerve root. No abnormal contrast enhancement of the thoracic or lumbar spine. IMPRESSION: 1. Motion degraded examination. 2. No acute findings. 3. L4-L5 left subarticular/foraminal disc protrusion with mild left foraminal stenosis and narrowing of the left lateral recess with mild displacement of the descending left L5 nerve root. 4. L5-S1 small left subarticular disc protrusion annular fissure in close proximity to the descending left S1 nerve root. 5. Right T1-2 uncovertebral spurring contributing to mild neural foraminal narrowing. Electronically Signed  By: Ulyses Jarred M.D.   On: 05/28/2019 21:41   Mr Lumbar Spine W Wo Contrast  Result Date: 05/28/2019 CLINICAL DATA:  Proximal bilateral lower extremity weakness. EXAM: MRI THORACIC AND LUMBAR SPINE WITHOUT AND WITH CONTRAST TECHNIQUE: Multiplanar and multiecho pulse sequences of the thoracic and lumbar spine were obtained without and with intravenous contrast. CONTRAST:  21m GADAVIST GADOBUTROL 1 MMOL/ML IV SOLN COMPARISON:  None. FINDINGS: MRI THORACIC SPINE FINDINGS The study is degraded by motion. Alignment:  Physiologic. Vertebrae: No  fracture, evidence of discitis, or bone lesion. Cord:  Normal signal and morphology. Paraspinal and other soft tissues: Negative. Disc levels: No disc herniation or stenosis. There is a small osseous spur on the right at the T1-2 level. MRI LUMBAR SPINE FINDINGS Segmentation:  Standard. Alignment:  Physiologic. Vertebrae:  No fracture, evidence of discitis, or bone lesion. Conus medullaris: Extends to the L1-2 level and appears normal. Paraspinal and other soft tissues: Negative Disc levels: The lumbar levels above L4-5 are normal. L4-5: Intermediate sized left subarticular/foraminal protrusion with mild left foraminal stenosis. There is narrowing of the left lateral recess with mild displacement of the descending left L5 nerve root. L5-S1: Small left subarticular disc protrusion annular fissure in close proximity to the descending left S1 nerve root. No abnormal contrast enhancement of the thoracic or lumbar spine. IMPRESSION: 1. Motion degraded examination. 2. No acute findings. 3. L4-L5 left subarticular/foraminal disc protrusion with mild left foraminal stenosis and narrowing of the left lateral recess with mild displacement of the descending left L5 nerve root. 4. L5-S1 small left subarticular disc protrusion annular fissure in close proximity to the descending left S1 nerve root. 5. Right T1-2 uncovertebral spurring contributing to mild neural foraminal narrowing. Electronically Signed   By: KUlyses JarredM.D.   On: 05/28/2019 21:41       Subjective: Reports pain and weakness of the lower extremity markedly improved.  Discharge Exam: Vitals:   05/29/19 2056 05/30/19 0428  BP: (!) 138/92 134/87  Pulse: 91 78  Resp: 20 18  Temp: 98.6 F (37 C) 97.7 F (36.5 C)  SpO2: 97% 99%   Vitals:   05/29/19 1430 05/29/19 1548 05/29/19 2056 05/30/19 0428  BP: 128/78 (!) 152/94 (!) 138/92 134/87  Pulse: 82 92 91 78  Resp: _0 Temp:  98.7 F (37.1 C) 98.6 F (37 C) 97.7 F (36.5 C)   TempSrc:  Oral Oral Oral  SpO2: 96% 98% 97% 99%  Weight:  110 kg    Height:  6' (1.829 m)      General: Middle-aged obese male not in distress HEENT: Moist mucosa, supple neck Chest: Clear CVs: Normal S1-S2 GI: Soft, nondistended, nontender Musculoskeletal: Warm, no edema, no lumbar or thoracic tenderness, 4/5 power in bilateral lower extremity with good sensation CNS: Alert and oriented,    The results of significant diagnostics from this hospitalization (including imaging, microbiology, ancillary and laboratory) are listed below for reference.     Microbiology: Recent Results (from the past 240 hour(s))  SARS CORONAVIRUS 2 (TAT 6-24 HRS) Nasopharyngeal Nasopharyngeal Swab     Status: None   Collection Time: 05/29/19 12:57 AM   Specimen: Nasopharyngeal Swab  Result Value Ref Range Status   SARS Coronavirus 2 NEGATIVE NEGATIVE Final    Comment: (NOTE) SARS-CoV-2 target nucleic acids are NOT DETECTED. The SARS-CoV-2 RNA is generally detectable in upper and lower respiratory specimens during the acute phase of infection. Negative results do not preclude SARS-CoV-2 infection, do  not rule out co-infections with other pathogens, and should not be used as the sole basis for treatment or other patient management decisions. Negative results must be combined with clinical observations, patient history, and epidemiological information. The expected result is Negative. Fact Sheet for Patients: SugarRoll.be Fact Sheet for Healthcare Providers: https://www.woods-mathews.com/ This test is not yet approved or cleared by the [place] FDA and  has been authorized for detection and/or diagnosis of SARS-CoV-2 by FDA under an Emergency Use Authorization (EUA). This EUA will remain  in effect (meaning this test can be used) for the duration of the COVID-19 declaration under Section 56 4(b)(1) of the Act, 21 U.S.C. section 360bbb-3(b)(1), unless  the authorization is terminated or revoked sooner. Performed at [facility], [place] [address], [place], [place] [address]      Labs: BNP (last 3 results) No results for input(s): BNP in the last 8760 hours. Basic Metabolic Panel: Recent Labs  Lab 05/28/19 1500 05/29/19 0559 05/30/19 0513  NA 137 135 138  K 3.6 4.1 4.4  CL 100 101 104  CO2 _0 GLUCOSE 110* 340* 163*  BUN _1 CREATININE 1.17 1.30* 1.08  CALCIUM 9.8 9.2 9.2  MG  --   --  2.2  PHOS  --   --  3.4   Liver Function Tests: Recent Labs  Lab 05/28/19 1500 05/30/19 0513  AST 21 27  ALT 23 36  ALKPHOS 85 61  BILITOT 1.1 0.9  PROT 9.0* 7.5  ALBUMIN 4.8 3.8   No results for input(s): LIPASE, AMYLASE in the last 168 hours. No results for input(s): AMMONIA in the last 168 hours. CBC: Recent Labs  Lab 05/28/19 1500 05/29/19 0559 05/30/19 0513  WBC 13.9* 9.3 15.0*  NEUTROABS 9.3*  --  12.5*  HGB 13.5 14.0 13.8  HCT 41.4 43.1 43.4  MCV 91.2 90.0 92.3  PLT 435* 301 317   Cardiac Enzymes: Recent Labs  Lab 05/28/19 1500  CKTOTAL 161   BNP: Invalid input(s): POCBNP CBG: Recent Labs  Lab 05/29/19 1642 05/29/19 2151 05/30/19 0736  GLUCAP 115* 161* 142*   D-Dimer No results for input(s): DDIMER in the last 72 hours. Hgb A1c Recent Labs    05/29/19 0559  HGBA1C 5.8*   Lipid Profile No results for input(s): CHOL, HDL, LDLCALC, TRIG, CHOLHDL, LDLDIRECT in the last 72 hours. Thyroid function studies No results for input(s): TSH, T4TOTAL, T3FREE, THYROIDAB in the last 72 hours.  Invalid input(s): FREET3 Anemia work up No results for input(s): VITAMINB12, FOLATE, FERRITIN, TIBC, IRON, RETICCTPCT in the last 72 hours. Urinalysis    Component Value Date/Time   COLORURINE YELLOW 05/28/2019 1732   APPEARANCEUR CLEAR 05/28/2019 1732   LABSPEC 1.020 05/28/2019 1732   PHURINE 5.0 05/28/2019 1732   GLUCOSEU NEGATIVE 05/28/2019 1732   HGBUR SMALL (A) 05/28/2019 1732   BILIRUBINUR  NEGATIVE 05/28/2019 1732   KETONESUR NEGATIVE 05/28/2019 1732   PROTEINUR NEGATIVE 05/28/2019 1732   UROBILINOGEN 1.0 10/16/2007 2052   NITRITE NEGATIVE 05/28/2019 1732   LEUKOCYTESUR SMALL (A) 05/28/2019 1732   Sepsis Labs Invalid input(s): PROCALCITONIN,  WBC,  LACTICIDVEN Microbiology Recent Results (from the past 240 hour(s))  SARS CORONAVIRUS 2 (TAT 6-24 HRS) Nasopharyngeal Nasopharyngeal Swab     Status: None   Collection Time: 05/29/19 12:57 AM   Specimen: Nasopharyngeal Swab  Result Value Ref Range Status   SARS Coronavirus 2 NEGATIVE NEGATIVE Final    Comment: (NOTE) SARS-CoV-2 target nucleic acids  are NOT DETECTED. The SARS-CoV-2 RNA is generally detectable in upper and lower respiratory specimens during the acute phase of infection. Negative results do not preclude SARS-CoV-2 infection, do not rule out co-infections with other pathogens, and should not be used as the sole basis for treatment or other patient management decisions. Negative results must be combined with clinical observations, patient history, and epidemiological information. The expected result is Negative. Fact Sheet for Patients: SugarRoll.be Fact Sheet for Healthcare Providers: https://www.woods-mathews.com/ This test is not yet approved or cleared by the [place] FDA and  has been authorized for detection and/or diagnosis of SARS-CoV-2 by FDA under an Emergency Use Authorization (EUA). This EUA will remain  in effect (meaning this test can be used) for the duration of the COVID-19 declaration under Section 56 4(b)(1) of the Act, 21 U.S.C. section 360bbb-3(b)(1), unless the authorization is terminated or revoked sooner. Performed at [facility], [place] [address] [place], [place] [address]      Time coordinating discharge: 25 minutes  SIGNED:   Louellen Molder, MD  [facility] 05/30/2019, 10:58 AM Pager   If 7PM-7AM, please  contact night-coverage [email or web address] Password [email or web address]

## 2019-05-30 NOTE — Evaluation (Signed)
Occupational Therapy Evaluation Patient Details Name: Patrick Hodges MRN: [id number] DOB: 07-02-1971 Today's Date: 05/30/2019    History of Present Illness Patrick Hodges is a 48 y.o. male with history of gout who had recently presented to the ER on April 02, 2019 with gout-like symptoms.  Pt now comes to ED with bil LE weakness.  MRI of T-spine and L-spoine shows mild neural foraminal narrowing and disc protrusion.  Initial plan was for MRI of C spine and transfer to  Patrick Hodges per neurology; however, pt's weakness is improving, MRI and transfer cancelled and PT/OT ordered.  Neurology recommending EMG/nerve conduction as outpatient if needed.   Clinical Impression   Pt was admitted for the above.  He is at a supervision/set up to mod I level for adls with the exception of tub transfer, for which he is in too much pain.  He has a standard commode at home and believes he will be OK with this. He got up from a comfort height commode at supervision level and will sponge bathe.  Educated on back precautions, if these help. With PT yesterday, rotation was the thing that increased pain slightly.  No further Ot is needed at this time     Follow Up Recommendations  No OT follow up    Equipment Recommendations  None recommended by OT(pt has standard toilet but feels he will be OK)  Pt does want a RW with 5" wheels  Recommendations for Other Services       Precautions / Restrictions Precautions Precautions: Fall Restrictions Weight Bearing Restrictions: No      Mobility Bed Mobility               General bed mobility comments: mod I with HOB. Pt states that he does usually log roll and push up  Transfers   Equipment used: Rolling walker (2 wheeled)   Sit to Stand: Supervision              Balance                                           ADL either performed or assessed with clinical judgement   ADL Overall ADL's : Needs assistance/impaired                         Toilet Transfer: Supervision/safety             General ADL Comments: pt functions at a supervision level for safety when up with RW.  He states that the walker helps with the pain in his leg.  He was able to don socks in bed by crossing leg up.  Educated on back precautions to help minimize pain.  Recommended that pt sponge bathe initially and demonstrated sidestepping into tub.  He walked in hall at end of session     Vision         Perception     Praxis      Pertinent Vitals/Pain Pain Score: 7  Pain Location: L LE and buttocks when walking; none in bed Pain Descriptors / Indicators: Discomfort;Sharp;Guarding Pain Intervention(s): Limited activity within patient's tolerance;Monitored during session;Premedicated before session;Repositioned     Hand Dominance     Extremity/Trunk Assessment Upper Extremity Assessment Upper Extremity Assessment: Overall WFL for tasks assessed  Communication Communication Communication: No difficulties   Cognition Arousal/Alertness: Awake/alert Behavior During Therapy: WFL for tasks assessed/performed Overall Cognitive Status: Within Functional Limits for tasks assessed                                     General Comments       Exercises     Shoulder Instructions      Home Living Family/patient expects to be discharged to:: Private residence Living Arrangements: Spouse/significant other Available Help at Discharge: Family;Available PRN/intermittently Type of Home: Apartment             Bathroom Shower/Tub: Chief Strategy Officer: Standard     Home Equipment: None          Prior Functioning/Environment Level of Independence: Independent                 OT Problem List:        OT Treatment/Interventions:      OT Goals(Current goals can be found in the care plan section) Acute Rehab OT Goals Patient Stated Goal: home OT Goal  Formulation: All assessment and education complete, DC therapy  OT Frequency:     Barriers to D/C:            Co-evaluation              AM-PAC OT "6 Clicks" Daily Activity     Outcome Measure Help from another person eating meals?: None Help from another person taking care of personal grooming?: A Little Help from another person toileting, which includes using toliet, bedpan, or urinal?: A Little Help from another person bathing (including washing, rinsing, drying)?: A Little Help from another person to put on and taking off regular upper body clothing?: A Little Help from another person to put on and taking off regular lower body clothing?: A Little 6 Click Score: 19   End of Session    Activity Tolerance: Patient tolerated treatment well Patient left: in bed;with call bell/phone within reach;with bed alarm set  OT Visit Diagnosis: Muscle weakness (generalized) (M62.81)                Time: 1610-9604 OT Time Calculation (min): 15 min Charges:  OT General Charges $OT Visit: 1 Visit OT Evaluation $OT Eval Low Complexity: 1 Low  Patrick Hodges, OTR/L Acute Rehabilitation Services [phone] [facility] pager [phone] office 05/30/2019  Patrick Hodges 05/30/2019, 10:48 AM

## 2019-06-12 ENCOUNTER — Ambulatory Visit: Payer: BC Managed Care – PPO | Attending: Internal Medicine | Admitting: Physical Therapy

## 2020-08-09 ENCOUNTER — Emergency Department (HOSPITAL_COMMUNITY)
Admission: EM | Admit: 2020-08-09 | Discharge: 2020-08-09 | Disposition: A | Discharge status: Home / Self Care | Payer: BC Managed Care – PPO | Attending: Emergency Medicine | Admitting: Emergency Medicine

## 2020-08-09 ENCOUNTER — Other Ambulatory Visit: Payer: Self-pay

## 2020-08-09 ENCOUNTER — Emergency Department (HOSPITAL_COMMUNITY): Payer: BC Managed Care – PPO

## 2020-08-09 ENCOUNTER — Encounter (HOSPITAL_COMMUNITY): Payer: Self-pay

## 2020-08-09 DIAGNOSIS — R079 Chest pain, unspecified: Secondary | ICD-10-CM | POA: Diagnosis present

## 2020-08-09 DIAGNOSIS — F17210 Nicotine dependence, cigarettes, uncomplicated: Secondary | ICD-10-CM | POA: Diagnosis not present

## 2020-08-09 DIAGNOSIS — J188 Other pneumonia, unspecified organism: Secondary | ICD-10-CM | POA: Diagnosis not present

## 2020-08-09 DIAGNOSIS — Z20822 Contact with and (suspected) exposure to covid-19: Secondary | ICD-10-CM | POA: Diagnosis not present

## 2020-08-09 DIAGNOSIS — J189 Pneumonia, unspecified organism: Secondary | ICD-10-CM

## 2020-08-09 LAB — BASIC METABOLIC PANEL
Anion gap: 10 (ref 5–15)
BUN: 16 mg/dL (ref 6–20)
CO2: 26 mmol/L (ref 22–32)
Calcium: 9.4 mg/dL (ref 8.9–10.3)
Chloride: 101 mmol/L (ref 98–111)
Creatinine, Ser: 1.42 mg/dL — ABNORMAL HIGH (ref 0.61–1.24)
GFR, Estimated: 60 mL/min — ABNORMAL LOW (ref >60)
Glucose, Bld: 120 mg/dL — ABNORMAL HIGH (ref 70–99)
Potassium: 4 mmol/L (ref 3.5–5.1)
Sodium: 137 mmol/L (ref 135–145)

## 2020-08-09 LAB — SARS CORONAVIRUS 2 (TAT 6-24 HRS): SARS Coronavirus 2: NEGATIVE

## 2020-08-09 LAB — CBC
HCT: 46 % (ref 39.0–52.0)
Hemoglobin: 15.1 g/dL (ref 13.0–17.0)
MCH: 29.3 pg (ref 26.0–34.0)
MCHC: 32.8 g/dL (ref 30.0–36.0)
MCV: 89.3 fL (ref 80.0–100.0)
Platelets: 324 10*3/uL (ref 150–400)
RBC: 5.15 MIL/uL (ref 4.22–5.81)
RDW: 13.9 % (ref 11.5–15.5)
WBC: 7.7 10*3/uL (ref 4.0–10.5)
nRBC: 0 % (ref 0.0–0.2)

## 2020-08-09 LAB — D-DIMER, QUANTITATIVE: D-Dimer, Quant: 0.6 ug/mL-FEU — ABNORMAL HIGH (ref 0.00–0.50)

## 2020-08-09 LAB — TROPONIN I (HIGH SENSITIVITY)
Troponin I (High Sensitivity): 3 ng/L (ref <18)
Troponin I (High Sensitivity): 3 ng/L (ref <18)

## 2020-08-09 MED ORDER — AZITHROMYCIN 250 MG PO TABS
500.0000 mg | ORAL_TABLET | Freq: Once | ORAL | Status: AC
  Administered 2020-08-09: 500 mg via ORAL
  Filled 2020-08-09: qty 2

## 2020-08-09 MED ORDER — MORPHINE SULFATE (PF) 4 MG/ML IV SOLN
4.0000 mg | Freq: Once | INTRAVENOUS | Status: AC
  Administered 2020-08-09: 4 mg via INTRAVENOUS
  Filled 2020-08-09: qty 1

## 2020-08-09 MED ORDER — ALBUTEROL SULFATE HFA 108 (90 BASE) MCG/ACT IN AERS: 2.0000 | INHALATION_SPRAY | RESPIRATORY_TRACT | Status: DC | PRN

## 2020-08-09 MED ORDER — IBUPROFEN 600 MG PO TABS: 600.0000 mg | ORAL_TABLET | Freq: Four times a day (QID) | ORAL | 0 refills | Status: DC | PRN

## 2020-08-09 MED ORDER — IOHEXOL 350 MG/ML SOLN
100.0000 mL | Freq: Once | INTRAVENOUS | Status: AC | PRN
  Administered 2020-08-09: 100 mL via INTRAVENOUS

## 2020-08-09 MED ORDER — ONDANSETRON HCL 4 MG/2ML IJ SOLN: 4.0000 mg | Freq: Once | INTRAMUSCULAR | Status: DC

## 2020-08-09 MED ORDER — SODIUM CHLORIDE 0.9 % IV SOLN
1.0000 g | Freq: Once | INTRAVENOUS | Status: AC
  Administered 2020-08-09: 1 g via INTRAVENOUS
  Filled 2020-08-09: qty 10

## 2020-08-09 MED ORDER — DOXYCYCLINE HYCLATE 100 MG PO CAPS: 100.0000 mg | ORAL_CAPSULE | Freq: Two times a day (BID) | ORAL | 0 refills | Status: DC

## 2020-08-09 NOTE — ED Provider Notes (Signed)
[facility]-EMERGENCY DEPT Provider Note   CSN: [id number] Arrival date & time: 08/09/20  0938     History Chief Complaint  Patient presents with  . Chest Pain    Patrick Hodges is a 50 y.o. male.  The history is provided by the patient. No language interpreter was used.  Chest Pain    50 year old obese male significant history of tobacco use and generalized weakness presenting complaining of chest pain.  Patient report for the past 3 days he has had pain in his chest.  Pain is primarily in the right side of chest, sharp, achy, pleuritic, worsening when he lays flat and improves when he sits up.  Pain is now gravitates towards the left side of the chest as well.  Pain is moderate to severe.  No associated fever or chills no runny nose sneezing or coughing no sore throat or congestion.  No loss of taste or smell no exertional chest pain or diaphoresis no complaints of nausea vomiting or diarrhea.  He has been vaccinated with COVID-19 with [employer] shot.  He denies any recent sick contact.  He is an occasional smoker.  No prior history of PE or DVT no recent surgery, prolonged bedrest, active cancer, or hemoptysis.  No complaints of calf swelling or leg pain.  Denies any recent strenuous activities or heavy lifting.  Past Medical History:  Diagnosis Date  . Gout     Patient Active Problem List   Diagnosis Date Noted  . Obesity (BMI 30-39.9) 05/30/2019  . Low back pain 05/29/2019  . Lower extremity weakness 05/29/2019  . Generalized weakness 05/29/2019    Past Surgical History:  Procedure Laterality Date  . TONSILLECTOMY         Family History  Problem Relation Age of Onset  . Diabetes Mellitus II Neg Hx     Social History   Tobacco Use  . Smoking status: Current Every Day Smoker    Types: Cigarettes  . Smokeless tobacco: Never Used  Substance Use Topics  . Alcohol use: Yes    Comment: occasion  . Drug use: No    Home  Medications Prior to Admission medications   Medication Sig Start Date End Date Taking? Authorizing Provider  allopurinol (ZYLOPRIM) 300 MG tablet Take 150 mg by mouth daily. 05/09/19   [provider]  methylPREDNISolone (MEDROL DOSEPAK) 4 MG TBPK tablet Take 8 mg twice daily for 2 days, then starting day 3, take 4 mg every 6 hours for total 10 doses, then stop 05/30/19   Dhungel, Nishant, MD  naproxen sodium (ALEVE) 220 MG tablet Take 880 mg by mouth daily as needed (pain).    [provider]    Allergies    Allopurinol, Diclofenac sodium, and Other  Review of Systems   Review of Systems  Cardiovascular: Positive for chest pain.  All other systems reviewed and are negative.   Physical Exam Updated Vital Signs BP (!) 170/109 (BP Location: Left Arm)   Pulse (!) 111   Temp 98.4 F (36.9 C) (Oral)   Resp (!) 22   Ht 6' (1.829 m)   Wt 108.9 kg   SpO2 97%   BMI 32.55 kg/m   Physical Exam Vitals and nursing note reviewed.  Constitutional:      General: He is not in acute distress.    Appearance: He is well-developed and well-nourished.  HENT:     Head: Atraumatic.  Eyes:     Conjunctiva/sclera: Conjunctivae normal.  Cardiovascular:     Rate and Rhythm: Tachycardia present.     Heart sounds: Murmur heard.  No friction rub. No gallop.   Pulmonary:     Effort: Pulmonary effort is normal.     Breath sounds: Normal breath sounds.  Chest:     Chest wall: No tenderness.  Abdominal:     Palpations: Abdomen is soft.     Tenderness: There is no abdominal tenderness.  Musculoskeletal:     Cervical back: Neck supple.     Right lower leg: No edema.     Left lower leg: No edema.  Skin:    Findings: No rash.  Neurological:     Mental Status: He is alert.  Psychiatric:        Mood and Affect: Mood and affect and mood normal.     ED Results / Procedures / Treatments   Labs (all labs ordered are listed, but only abnormal results are displayed) Labs  Reviewed  BASIC METABOLIC PANEL - Abnormal; Notable for the following components:      Result Value   Glucose, Bld 120 (*)    Creatinine, Ser 1.42 (*)    GFR, Estimated 60 (*)    All other components within normal limits  D-DIMER, QUANTITATIVE (NOT AT [facility]) - Abnormal; Notable for the following components:   D-Dimer, Quant 0.60 (*)    All other components within normal limits  SARS CORONAVIRUS 2 (TAT 6-24 HRS)  CBC  TROPONIN I (HIGH SENSITIVITY)  TROPONIN I (HIGH SENSITIVITY)    EKG EKG Interpretation  Date/Time:  Saturday August 09 2020 06:06:34 EST Ventricular Rate:  108 PR Interval:    QRS Duration: 87 QT Interval:  308 QTC Calculation: 413 R Axis:   171 Text Interpretation: Sinus tachycardia Ventricular premature complex Aberrant complex Left posterior fascicular block Borderline T abnormalities, inferior leads 12 Lead; Mason-Likar NO sig change from prio ecg Confirmed by Alvester Chou [phone]) on 08/09/2020 8:33:06 AM   ED ECG REPORT   Date: 08/09/2020  Rate: 108  Rhythm: sinus tachycardia  QRS Axis: normal  Intervals: normal  ST/T Wave abnormalities: nonspecific T wave changes  Conduction Disutrbances:PVC  Narrative Interpretation:   Old EKG Reviewed: unchanged  I have personally reviewed the EKG tracing and agree with the computerized printout as noted.   Radiology DG Chest 2 View  Result Date: 08/09/2020 CLINICAL DATA:  Right-sided chest pain with progression over the last 2 days. Pain is worse with deep inspiration. EXAM: CHEST - 2 VIEW COMPARISON:  Two-view chest x-ray 04/02/2019 FINDINGS: Heart size is normal. Lung volumes are low. Focal airspace opacity is present in the lateral aspect of the left lower lobe and medial right lower lobe. No significant effusions are present. IMPRESSION: Focal airspace disease in the lateral aspect of the left lower lobe and medial right lower lobe concerning for pneumonia. Electronically Signed   By: Marin Roberts M.D.    On: 08/09/2020 06:35   CT Angio Chest PE W and/or Wo Contrast  Result Date: 08/09/2020 CLINICAL DATA:  Positive D-dimer. EXAM: CT ANGIOGRAPHY CHEST WITH CONTRAST TECHNIQUE: Multidetector CT imaging of the chest was performed using the standard protocol during bolus administration of intravenous contrast. Multiplanar CT image reconstructions and MIPs were obtained to evaluate the vascular anatomy. CONTRAST:  OMNIPAQUE IOHEXOL 350 MG/ML SOLN COMPARISON:  April 02, 2019. FINDINGS: Cardiovascular: Satisfactory opacification of the pulmonary arteries to the segmental level. No evidence of pulmonary embolism. Normal heart size. No pericardial effusion. Mediastinum/Nodes: No  enlarged mediastinal, hilar, or axillary lymph nodes. Thyroid gland, trachea, and esophagus demonstrate no significant findings. Lungs/Pleura: No pneumothorax or pleural effusion is noted. Minimal bibasilar subsegmental atelectasis is noted. Upper Abdomen: Hepatic steatosis. Musculoskeletal: No chest wall abnormality. No acute or significant osseous findings. Review of the MIP images confirms the above findings. IMPRESSION: 1. No definite evidence of pulmonary embolus. 2. Minimal bibasilar subsegmental atelectasis. 3. Hepatic steatosis. Electronically Signed   By: Lupita Raider M.D.   On: 08/09/2020 08:40    Procedures Procedures   Medications Ordered in ED Medications  ondansetron (ZOFRAN) injection 4 mg (has no administration in time range)  albuterol (VENTOLIN HFA) 108 (90 Base) MCG/ACT inhaler 2 puff (has no administration in time range)  cefTRIAXone (ROCEPHIN) 1 g in sodium chloride 0.9 % 100 mL IVPB (1 g Intravenous New Bag/Given 08/09/20 0756)  azithromycin (ZITHROMAX) tablet 500 mg (500 mg Oral Given 08/09/20 0756)  morphine 4 MG/ML injection 4 mg (4 mg Intravenous Given 08/09/20 0756)  iohexol (OMNIPAQUE) 350 MG/ML injection 100 mL (100 mLs Intravenous Contrast Given 08/09/20 0815)    ED Course  I have reviewed the  triage vital signs and the nursing notes.  Pertinent labs & imaging results that were available during my care of the patient were reviewed by me and considered in my medical decision making (see chart for details).    MDM Rules/Calculators/A&P                          BP (!) 171/107   Pulse (!) 102   Temp 98.4 F (36.9 C) (Oral)   Resp 17   Ht 6' (1.829 m)   Wt 108.9 kg   SpO2 96%   BMI 32.55 kg/m   Final Clinical Impression(s) / ED Diagnoses Final diagnoses:  Multifocal pneumonia  Suspected COVID-19 virus infection    Rx / DC Orders ED Discharge Orders         Ordered    ibuprofen (ADVIL) 600 MG tablet  Every 6 hours PRN        08/09/20 0923    doxycycline (VIBRAMYCIN) 100 MG capsule  2 times daily        08/09/20 3762         Patient here with complaints of pleuritic chest pain for the past 3 days.  He has had [employer] vaccinated for [employer].  He denies any other COVID-like symptoms.  His chest pain is atypical of ACS.  He is at risk for PE given the pleuritic nature and tachycardia.  His chest x-ray shows focal airspace disease in the lower aspect of the left lower lobe and medial right lower lobe concerning for pneumonia.  This finding could suggest COVID infection however it would be worthwhile to initiate antibiotic for multifocal pneumonia in case if it is a bacterial infection.  Will start Rocephin and Zithromax.  9:16 AM D-dimer is mildly elevated at 0.60.  Chest CT angiogram was obtained and fortunately no evidence of PE.  There is minimal bilateral basilar subsegmental atelectasis noted.  Some evidence of hepatic steatosis.  Gallbladder was not visualized.  At this time, Covid test is currently pending.  Plan to discharge home with doxycycline for potential atypical pneumonia.  When ambulating patient maintained O2 sats at 99% on room air.  Albuterol inhaler provided as needed for shortness of breath.  Ibuprofen as needed for pain.  Patient encouraged to  return to the ER if symptoms  worsen.  No report of postprandial pain to suspect GI cause.  CAre discussed with Dr. Renaye Rakers.   MARKS SCALERA was evaluated in Emergency Department on 08/09/2020 for the symptoms described in the history of present illness. He was evaluated in the context of the global COVID-19 pandemic, which necessitated consideration that the patient might be at risk for infection with the SARS-CoV-2 virus that causes COVID-19. Institutional protocols and algorithms that pertain to the evaluation of patients at risk for COVID-19 are in a state of rapid change based on information released by regulatory bodies including the CDC and federal and state organizations. These policies and algorithms were followed during the patient's care in the ED.    Fayrene Helper, PA-C 08/09/20 [id number]    Terald Sleeper, MD 08/09/20 [phone]

## 2020-08-09 NOTE — ED Triage Notes (Signed)
Pt reports right sided chest pain getting worse for the last 2 days. Pain worse on deep inspiration.

## 2020-08-09 NOTE — ED Notes (Signed)
CT Angio Chest PE W and/or Wo Contrast Completed CT Angio Chest PE W and/or Wo Contrast   This CT still need to be done it is not completed I check it by mistake please do it. Thank you.

## 2020-08-09 NOTE — ED Notes (Signed)
Pt O2 is at 99 while ambulating.

## 2020-08-09 NOTE — Discharge Instructions (Signed)
Chest x-ray shows possibility of pneumonia in your lungs.  It would be worthwhile to take antibiotic as prescribed.  Take ibuprofen as needed for pain and use albuterol inhaler 2 puffs every 4 hours as needed for shortness of breath.  If you develop pain worsening with eating, nausea vomiting or diarrhea having fever or worsening shortness of breath do not hesitate to return for further evaluation.  Please check your COVID test through MyChart, link below as it will likely result in the next 6 to 24 hours.  If you do test positive for Covid infection, follow instruction below  Recommendations for at home COVID-19 symptoms management:  Please continue isolation at home. Call [phone] to see whether you might be eligible for therapeutic antibody infusions (leave your name and they will call you back).  If have acute worsening of symptoms please go to ER/urgent care for further evaluation. Check pulse oximetry and if below 90-92% please go to ER. The following supplements MAY help:  Vitamin C 500mg  twice a day and Quercetin 250-500 mg twice a day Vitamin D3 2000 - 4000 u/day B Complex vitamins Zinc 75-100 mg/day Melatonin 6-10 mg at night (the optimal dose is unknown) Aspirin 81mg /day (if no history of bleeding issues)

## 2021-01-18 ENCOUNTER — Other Ambulatory Visit: Payer: Self-pay

## 2021-01-18 ENCOUNTER — Ambulatory Visit
Admission: EM | Admit: 2021-01-18 | Discharge: 2021-01-18 | Disposition: A | Discharge status: Home / Self Care | Payer: BC Managed Care – PPO | Attending: Emergency Medicine | Admitting: Emergency Medicine

## 2021-01-18 DIAGNOSIS — M10471 Other secondary gout, right ankle and foot: Secondary | ICD-10-CM

## 2021-01-18 MED ORDER — COLCHICINE 0.6 MG PO TABS: ORAL_TABLET | ORAL | 0 refills | Status: DC

## 2021-01-18 MED ORDER — INDOMETHACIN 25 MG PO CAPS: 50.0000 mg | ORAL_CAPSULE | Freq: Three times a day (TID) | ORAL | 0 refills | Status: AC | PRN

## 2021-01-18 MED ORDER — PREDNISONE 20 MG PO TABS: 40.0000 mg | ORAL_TABLET | Freq: Every day | ORAL | 0 refills | Status: AC

## 2021-01-18 NOTE — ED Provider Notes (Signed)
HPI  SUBJECTIVE: Chart already dictated.  This is a duplicate.  See other chart.  Past Medical History:  Diagnosis Date   Gout     Past Surgical History:  Procedure Laterality Date   TONSILLECTOMY      Family History  Problem Relation Age of Onset   Diabetes Mellitus II Neg Hx     Social History   Tobacco Use   Smoking status: Every Day    Types: Cigarettes   Smokeless tobacco: Never  Substance Use Topics   Alcohol use: Yes    Comment: occasion   Drug use: No    No current facility-administered medications for this encounter.  Current Outpatient Medications:    allopurinol (ZYLOPRIM) 300 MG tablet, Take 150 mg by mouth daily., Disp: , Rfl:    colchicine 0.6 MG tablet, 2 tabs po x 1, then one tab po 1 hour later, Disp: 6 tablet, Rfl: 0   indomethacin (INDOCIN) 25 MG capsule, Take 2 capsules (50 mg total) by mouth 3 (three) times daily as needed for up to 5 days., Disp: 30 capsule, Rfl: 0   predniSONE (DELTASONE) 20 MG tablet, Take 2 tablets (40 mg total) by mouth daily with breakfast for 5 days., Disp: 10 tablet, Rfl: 0  Allergies  Allergen Reactions   Allopurinol Other (See Comments)   Diclofenac Sodium Other (See Comments)   Other Other (See Comments)     ROS  As noted in HPI.   Physical Exam  BP 129/85 (BP Location: Right arm)   Pulse 87   Temp 98.3 F (36.8 C) (Oral)   Resp 18   SpO2 100%   Constitutional: Well developed, well nourished, no acute distress Eyes:  EOMI, conjunctiva normal bilaterally HENT: Normocephalic, atraumatic,mucus membranes moist Respiratory: Normal inspiratory effort Cardiovascular: Normal rate GI: nondistended skin: No rash, skin intact Musculoskeletal: Positive soft tissue swelling of the foot, right ankle.  Tenderness along the medial malleolus and medial ankle.  Pain with all range of motion.  No erythema.  DP 2+.  Motor and sensation distally grossly intact. Neurologic: Alert & oriented x 3, no focal neuro  deficits Psychiatric: Speech and behavior appropriate   ED Course   Medications - No data to display  No orders of the defined types were placed in this encounter.   No results found for this or any previous visit (from the past 24 hour(s)). No results found.  ED Clinical Impression  1. Acute gout of right ankle, unspecified cause       ED Assessment/Plan  Patient with a recurrent gout flare caused by dietary indiscretion.  Discontinue other NSAIDs, will send home with indomethacin per patient request, colchicine prednisone.  Follow-up with PMD as needed.  ER return precautions given.  Discussed  MDM, treatment plan, and plan for follow-up with patient. Discussed sn/sx that should prompt return to the ED. patient agrees with plan.   Meds ordered this encounter  Medications   DISCONTD: indomethacin (INDOCIN) 50 MG capsule    Sig: Take 1 capsule (50 mg total) by mouth 3 (three) times daily with meals. Stop medication when pain resolves    Dispense:  21 capsule    Refill:  0   DISCONTD: HYDROcodone-acetaminophen (NORCO/VICODIN) 5-325 MG per tablet    Sig: Take 1-2 tablets by mouth every 6 (six) hours as needed for severe pain.    Dispense:  10 tablet    Refill:  0      *This clinic note was created using Dragon  dictation software. Therefore, there may be occasional mistakes despite careful proofreading.  ?    Domenick Gong, MD 01/20/21 [phone]

## 2021-01-18 NOTE — Discharge Instructions (Addendum)
Take the indomethacin with 1000 mg of Tylenol as written.  Finish the colchicine and prednisone, even if you feel better.

## 2021-01-18 NOTE — ED Triage Notes (Signed)
One week h/o gout in his right foot and ankle. Has been taking Aleve and Ibuprofen without relief. Pt has a h/o gout. Pt is currently ambulating with crutch due to pain. No falls or injuries.

## 2021-01-18 NOTE — ED Provider Notes (Signed)
HPI  SUBJECTIVE:  Patrick Hodges is a 50 y.o. male who presents with right ankle pain for the past week that is consistent with previous gout flares.  He states that this was triggered by eating some steak.  He reports erythema, swelling, increased temperature and hypersensitivity.  If we are running, fevers, body aches.  He has tried ice, crutches, Tylenol, 1000 mg, ibuprofen, Aleve.  The NSAIDs help.  Symptoms are worse with weightbearing, movement, palpation.  He has a past medical history of gout and ran out of his allopurinol.  Known history of diabetes, hypertension, chronic kidney disease.  UEK:CMKLK, Resa Miner, FNP   Past Medical History:  Diagnosis Date   Gout     Past Surgical History:  Procedure Laterality Date   TONSILLECTOMY      Family History  Problem Relation Age of Onset   Diabetes Mellitus II Neg Hx     Social History   Tobacco Use   Smoking status: Every Day    Types: Cigarettes   Smokeless tobacco: Never  Substance Use Topics   Alcohol use: Yes    Comment: occasion   Drug use: No    No current facility-administered medications for this encounter.  Current Outpatient Medications:    colchicine 0.6 MG tablet, 2 tabs po x 1, then one tab po 1 hour later, Disp: 6 tablet, Rfl: 0   indomethacin (INDOCIN) 25 MG capsule, Take 2 capsules (50 mg total) by mouth 3 (three) times daily as needed for up to 5 days., Disp: 30 capsule, Rfl: 0   predniSONE (DELTASONE) 20 MG tablet, Take 2 tablets (40 mg total) by mouth daily with breakfast for 5 days., Disp: 10 tablet, Rfl: 0   allopurinol (ZYLOPRIM) 300 MG tablet, Take 150 mg by mouth daily., Disp: , Rfl:   Allergies  Allergen Reactions   Allopurinol Other (See Comments)   Diclofenac Sodium Other (See Comments)   Other Other (See Comments)     ROS  As noted in HPI.   Physical Exam  BP (!) 148/97 (BP Location: Left Arm)   Pulse 73   Temp 98.2 F (36.8 C) (Oral)   Resp 18   SpO2 96%   Constitutional:  Well developed, well nourished, no acute distress Eyes:  EOMI, conjunctiva normal bilaterally HENT: Normocephalic, atraumatic,mucus membranes moist Respiratory: Normal inspiratory effort Cardiovascular: Normal rate GI: nondistended skin: No rash, skin intact Musculoskeletal: Tenderness medial right ankle along the ligaments and medial malleolus, positive mild ankle and foot swelling.  No erythema.  Positive increased temperature.  Pain with all range of motion.  DP 2+. sensation, motor intact distally.   Proximal fibula NT  Distal fibula NT,   ATFL NT, calcaneofibular ligament NT , posterior tablofibular ligament NT ,  Achilles NT, calcaneus  NT,  Proximal 5th metatarsal NT, Midfoot NT, Pain  with dorsiflexion/plantar flexion. Pain  with inversion/eversion. - bruising.  Neurologic: Alert & oriented x 3, no focal neuro deficits Psychiatric: Speech and behavior appropriate   ED Course   Medications - No data to display  No orders of the defined types were placed in this encounter.   No results found for this or any previous visit (from the past 24 hour(s)). No results found.  ED Clinical Impression  1. Other secondary acute gout of right ankle      ED Assessment/Plan  Presentation consistent with gout flare.  Patient requesting indomethacin.  Advised him to stop taking Aleve and ibuprofen and to take the indomethacin  with Tylenol instead.  Home colchicine, prednisone.  Follow-up with PMD as needed.  ER return precautions given.  Discusse MDM, treatment plan, and plan for follow-up with patient. Discussed sn/sx that should prompt return to the ED. patient agrees with plan.   Meds ordered this encounter  Medications   colchicine 0.6 MG tablet    Sig: 2 tabs po x 1, then one tab po 1 hour later    Dispense:  6 tablet    Refill:  0   indomethacin (INDOCIN) 25 MG capsule    Sig: Take 2 capsules (50 mg total) by mouth 3 (three) times daily as needed for up to 5 days.    Dispense:   30 capsule    Refill:  0   predniSONE (DELTASONE) 20 MG tablet    Sig: Take 2 tablets (40 mg total) by mouth daily with breakfast for 5 days.    Dispense:  10 tablet    Refill:  0      *This clinic note was created using Scientist, clinical (histocompatibility and immunogenetics). Therefore, there may be occasional mistakes despite careful proofreading.  ?    Domenick Gong, MD 01/20/21 [phone]

## 2021-02-10 ENCOUNTER — Ambulatory Visit
Admission: EM | Admit: 2021-02-10 | Discharge: 2021-02-10 | Disposition: A | Discharge status: Home / Self Care | Payer: BC Managed Care – PPO | Attending: Internal Medicine | Admitting: Internal Medicine

## 2021-02-10 ENCOUNTER — Other Ambulatory Visit: Payer: Self-pay

## 2021-02-10 DIAGNOSIS — M109 Gout, unspecified: Secondary | ICD-10-CM

## 2021-02-10 DIAGNOSIS — M79675 Pain in left toe(s): Secondary | ICD-10-CM | POA: Diagnosis not present

## 2021-02-10 MED ORDER — COLCHICINE 0.6 MG PO TABS: ORAL_TABLET | ORAL | 0 refills | Status: DC

## 2021-02-10 MED ORDER — INDOMETHACIN 25 MG PO CAPS: 25.0000 mg | ORAL_CAPSULE | Freq: Three times a day (TID) | ORAL | 0 refills | Status: DC | PRN

## 2021-02-10 MED ORDER — INDOMETHACIN 25 MG PO CAPS: 25.0000 mg | ORAL_CAPSULE | Freq: Three times a day (TID) | ORAL | 0 refills | Status: AC | PRN

## 2021-02-10 NOTE — Discharge Instructions (Addendum)
You have been prescribed colchicine and indomethacin to treat gout.  Please avoid taking any over-the-counter ibuprofen, Advil, Aleve while taking indomethacin.

## 2021-02-10 NOTE — ED Triage Notes (Signed)
Pt c/o gout flare up to lt foot since Thursday. States out of his allopurinol. States has appt with pcp 8/26.

## 2021-02-10 NOTE — ED Provider Notes (Signed)
[facility]    CSN: [id number] Arrival date & time: 02/10/21  0901      History   Chief Complaint Chief Complaint  Patient presents with   Gout    HPI Patrick Hodges is a 50 y.o. male.   Patient presents with left great toe pain that started approximately 6 days ago.  Patient reports that this is most likely a gout flareup as he has them frequently.  Last gout flareup was in July.  Left great toe is painful, erythematous, warm to touch.  Patient has not been taking allopurinol for "quite some time" due to being out of allopurinol and having a mild allergy.  Patient has appointment with PCP on 02/27/2021 for further evaluation and management of gout.  Denies eating any red meat, fish, drinking alcohol lately.  Denies any injury to foot or toes. Has taken over-the-counter Advil without relief of pain.    Past Medical History:  Diagnosis Date   Gout     Patient Active Problem List   Diagnosis Date Noted   Obesity (BMI 30-39.9) 05/30/2019   Low back pain 05/29/2019   Lower extremity weakness 05/29/2019   Generalized weakness 05/29/2019    Past Surgical History:  Procedure Laterality Date   TONSILLECTOMY         Home Medications    Prior to Admission medications   Medication Sig Start Date End Date Taking? Authorizing Provider  allopurinol (ZYLOPRIM) 300 MG tablet Take 150 mg by mouth daily. 05/09/19   [provider]  colchicine 0.6 MG tablet 2 tabs po x 1, then one tab po 1 hour later 02/10/21   Lance Muss, FNP  indomethacin (INDOCIN) 25 MG capsule Take 1 capsule (25 mg total) by mouth 3 (three) times daily as needed for up to 5 days for mild pain or moderate pain. 02/10/21 02/15/21  Lance Muss, FNP    Family History Family History  Problem Relation Age of Onset   Diabetes Mellitus II Neg Hx     Social History Social History   Tobacco Use   Smoking status: Every Day    Types: Cigarettes   Smokeless tobacco: Never  Substance Use  Topics   Alcohol use: Yes    Comment: occasion   Drug use: No     Allergies   Allopurinol, Diclofenac sodium, and Other   Review of Systems Review of Systems Per HPI  Physical Exam Triage Vital Signs ED Triage Vitals [02/10/21 0921]  Enc Vitals Group     BP 128/90     Pulse Rate 75     Resp 18     Temp 97.8 F (36.6 C)     Temp Source Oral     SpO2 97 %     Weight      Height      Head Circumference      Peak Flow      Pain Score 7     Pain Loc      Pain Edu?      Excl. in GC?    No data found.  Updated Vital Signs BP 128/90 (BP Location: Left Arm)   Pulse 75   Temp 97.8 F (36.6 C) (Oral)   Resp 18   SpO2 97%   Visual Acuity Right Eye Distance:   Left Eye Distance:   Bilateral Distance:    Right Eye Near:   Left Eye Near:    Bilateral Near:     Physical  Exam Constitutional:      Appearance: Normal appearance.  HENT:     Head: Normocephalic and atraumatic.  Eyes:     Extraocular Movements: Extraocular movements intact.     Conjunctiva/sclera: Conjunctivae normal.  Pulmonary:     Effort: Pulmonary effort is normal.  Musculoskeletal:     Right foot: Normal.     Left foot: Normal capillary refill. Swelling and tenderness present. Normal pulse.     Comments: Left great toe has mild edema and is erythematous and painful to palpation.  Also is warm to touch.  Neurological:     General: No focal deficit present.     Mental Status: He is alert and oriented to person, place, and time. Mental status is at baseline.  Psychiatric:        Mood and Affect: Mood normal.        Behavior: Behavior normal.        Thought Content: Thought content normal.        Judgment: Judgment normal.     UC Treatments / Results  Labs (all labs ordered are listed, but only abnormal results are displayed) Labs Reviewed - No data to display  EKG   Radiology No results found.  Procedures Procedures (including critical care time)  Medications Ordered in  UC Medications - No data to display  Initial Impression / Assessment and Plan / UC Course  I have reviewed the triage vital signs and the nursing notes.  Pertinent labs & imaging results that were available during my care of the patient were reviewed by me and considered in my medical decision making (see chart for details).     Left great toe pain is consistent with gout flareup.  Will treat with colchicine and indomethacin as needed.  Advised patient to not take any over-the-counter NSAIDs while taking indomethacin.  Patient to follow-up with PCP for further evaluation and management of frequent gout flareups.Discussed strict return precautions. Patient verbalized understanding and is agreeable with plan.  Final Clinical Impressions(s) / UC Diagnoses   Final diagnoses:  Gouty arthritis of left great toe  Great toe pain, left     Discharge Instructions      You have been prescribed colchicine and indomethacin to treat gout.  Please avoid taking any over-the-counter ibuprofen, Advil, Aleve while taking indomethacin.      ED Prescriptions     Medication Sig Dispense Auth. Provider   colchicine 0.6 MG tablet  (Status: Discontinued) 2 tabs po x 1, then one tab po 1 hour later 6 tablet Lance Muss, FNP   indomethacin (INDOCIN) 25 MG capsule  (Status: Discontinued) Take 1 capsule (25 mg total) by mouth 3 (three) times daily as needed for up to 5 days for mild pain or moderate pain. 15 capsule Lance Muss, FNP   indomethacin (INDOCIN) 25 MG capsule Take 1 capsule (25 mg total) by mouth 3 (three) times daily as needed for up to 5 days for mild pain or moderate pain. 15 capsule Henrene Dodge E, FNP   colchicine 0.6 MG tablet 2 tabs po x 1, then one tab po 1 hour later 6 tablet Lance Muss, FNP      PDMP not reviewed this encounter.   Lance Muss, FNP 02/10/21 [phone]

## 2021-03-12 ENCOUNTER — Encounter (HOSPITAL_COMMUNITY): Payer: Self-pay

## 2021-03-12 ENCOUNTER — Emergency Department (HOSPITAL_COMMUNITY)
Admission: EM | Admit: 2021-03-12 | Discharge: 2021-03-12 | Disposition: A | Discharge status: Home / Self Care | Payer: BC Managed Care – PPO | Attending: Emergency Medicine | Admitting: Emergency Medicine

## 2021-03-12 ENCOUNTER — Other Ambulatory Visit: Payer: Self-pay

## 2021-03-12 ENCOUNTER — Emergency Department (HOSPITAL_COMMUNITY): Payer: BC Managed Care – PPO

## 2021-03-12 DIAGNOSIS — F17210 Nicotine dependence, cigarettes, uncomplicated: Secondary | ICD-10-CM | POA: Insufficient documentation

## 2021-03-12 DIAGNOSIS — R0789 Other chest pain: Secondary | ICD-10-CM

## 2021-03-12 DIAGNOSIS — R059 Cough, unspecified: Secondary | ICD-10-CM | POA: Diagnosis not present

## 2021-03-12 LAB — BRAIN NATRIURETIC PEPTIDE: B Natriuretic Peptide: 12.6 pg/mL (ref 0.0–100.0)

## 2021-03-12 LAB — CBC
HCT: 44.9 % (ref 39.0–52.0)
Hemoglobin: 15 g/dL (ref 13.0–17.0)
MCH: 29.9 pg (ref 26.0–34.0)
MCHC: 33.4 g/dL (ref 30.0–36.0)
MCV: 89.4 fL (ref 80.0–100.0)
Platelets: 284 10*3/uL (ref 150–400)
RBC: 5.02 MIL/uL (ref 4.22–5.81)
RDW: 14.7 % (ref 11.5–15.5)
WBC: 8.3 10*3/uL (ref 4.0–10.5)
nRBC: 0 % (ref 0.0–0.2)

## 2021-03-12 LAB — COMPREHENSIVE METABOLIC PANEL
ALT: 26 U/L (ref 0–44)
AST: 18 U/L (ref 15–41)
Albumin: 4.6 g/dL (ref 3.5–5.0)
Alkaline Phosphatase: 71 U/L (ref 38–126)
Anion gap: 11 (ref 5–15)
BUN: 25 mg/dL — ABNORMAL HIGH (ref 6–20)
CO2: 25 mmol/L (ref 22–32)
Calcium: 9.7 mg/dL (ref 8.9–10.3)
Chloride: 106 mmol/L (ref 98–111)
Creatinine, Ser: 1.37 mg/dL — ABNORMAL HIGH (ref 0.61–1.24)
GFR, Estimated: >60 mL/min (ref >60)
Glucose, Bld: 106 mg/dL — ABNORMAL HIGH (ref 70–99)
Potassium: 4 mmol/L (ref 3.5–5.1)
Sodium: 142 mmol/L (ref 135–145)
Total Bilirubin: 0.5 mg/dL (ref 0.3–1.2)
Total Protein: 8 g/dL (ref 6.5–8.1)

## 2021-03-12 LAB — TROPONIN I (HIGH SENSITIVITY)
Troponin I (High Sensitivity): 3 ng/L (ref <18)
Troponin I (High Sensitivity): 2 ng/L (ref <18)

## 2021-03-12 LAB — D-DIMER, QUANTITATIVE: D-Dimer, Quant: 0.39 ug/mL-FEU (ref 0.00–0.50)

## 2021-03-12 MED ORDER — ACETAMINOPHEN 500 MG PO TABS
1000.0000 mg | ORAL_TABLET | Freq: Once | ORAL | Status: AC
  Administered 2021-03-12: 1000 mg via ORAL
  Filled 2021-03-12: qty 2

## 2021-03-12 NOTE — ED Notes (Signed)
Blood work redrawn and resent to lab

## 2021-03-12 NOTE — ED Provider Notes (Signed)
Emergency Medicine Provider Triage Evaluation Note  Patrick Hodges , a 50 y.o. male  was evaluated in triage.  Pt complains of chest pain- right sided, constant, worse with movement, no alleviating factors. Has had minimal cough.   Review of Systems  Positive: Chest pain Negative: Fever, vomiting, diaphoresis, syncope, leg pain/swelling, hemoptysis.   Physical Exam  BP (!) 146/97 (BP Location: Left Arm)   Pulse 98   Temp 98.3 F (36.8 C) (Oral)   Resp 18   SpO2 96%  Gen:   Awake, no distress   Resp:  Normal effort  MSK:   Moves extremities without difficulty  Other:  Right chest wall TTP.   Medical Decision Making  Medically screening exam initiated at 2:14 AM.  Appropriate orders placed.  Katherine Mantle was informed that the remainder of the evaluation will be completed by another provider, this initial triage assessment does not replace that evaluation, and the importance of remaining in the ED until their evaluation is complete.  Chest pain.    Desmond Lope 03/12/21 0214    Dione Booze, MD 03/12/21 [phone]

## 2021-03-12 NOTE — ED Provider Notes (Signed)
[facility]-EMERGENCY DEPT Provider Note   CSN: [id number] Arrival date & time: 03/12/21  0108     History Chief Complaint  Patient presents with   Chest Pain    Patrick Hodges is a 50 y.o. male with PMH obesity, gout who presents to the emergency department for evaluation of chest pain.  Patient states that chest pain has been present for the last 3 days and is gradually worsened.  He states his pain is right-sided and is a combination of both sharp pain exacerbated with movement and pressure-like chest pain that he describes as a "truck sitting on his chest.  He states that the pressure is worse when leaning forward and laying down flat at night.  Denies associated diaphoresis, nausea, vomiting, fevers, chills or other systemic symptoms.  He does endorse a weak cough that is nonproductive.  He is endorsing active chest pain here in the emergency department.   Chest Pain Associated symptoms: cough   Associated symptoms: no abdominal pain, no back pain, no fever, no palpitations, no shortness of breath and no vomiting       Past Medical History:  Diagnosis Date   Gout     Patient Active Problem List   Diagnosis Date Noted   Obesity (BMI 30-39.9) 05/30/2019   Low back pain 05/29/2019   Lower extremity weakness 05/29/2019   Generalized weakness 05/29/2019    Past Surgical History:  Procedure Laterality Date   TONSILLECTOMY         Family History  Problem Relation Age of Onset   Diabetes Mellitus II Neg Hx     Social History   Tobacco Use   Smoking status: Every Day    Types: Cigarettes   Smokeless tobacco: Never  Substance Use Topics   Alcohol use: Yes    Comment: occasion   Drug use: No    Home Medications Prior to Admission medications   Medication Sig Start Date End Date Taking? Authorizing Provider  allopurinol (ZYLOPRIM) 300 MG tablet Take 150 mg by mouth daily. 05/09/19   [provider]  colchicine 0.6 MG tablet 2 tabs  po x 1, then one tab po 1 hour later 02/10/21   Lance Muss, FNP  febuxostat (ULORIC) 40 MG tablet Take 40 mg by mouth daily. 03/03/21   [provider]    Allergies    Allopurinol, Diclofenac sodium, and Other  Review of Systems   Review of Systems  Constitutional:  Negative for chills and fever.  HENT:  Negative for ear pain and sore throat.   Eyes:  Negative for pain and visual disturbance.  Respiratory:  Positive for cough. Negative for shortness of breath.   Cardiovascular:  Positive for chest pain. Negative for palpitations.  Gastrointestinal:  Negative for abdominal pain and vomiting.  Genitourinary:  Negative for dysuria and hematuria.  Musculoskeletal:  Negative for arthralgias and back pain.  Skin:  Negative for color change and rash.  Neurological:  Negative for seizures and syncope.  All other systems reviewed and are negative.  Physical Exam Updated Vital Signs BP (!) 144/117   Pulse 76   Temp 98 F (36.7 C) (Oral)   Resp (!) 8   Ht 6' (1.829 m)   Wt 113.4 kg   SpO2 96%   BMI 33.91 kg/m   Physical Exam Vitals and nursing note reviewed.  Constitutional:      Appearance: He is well-developed.  HENT:     Head: Normocephalic and atraumatic.  Eyes:     Conjunctiva/sclera: Conjunctivae normal.  Cardiovascular:     Rate and Rhythm: Normal rate and regular rhythm.     Heart sounds: No murmur heard. Pulmonary:     Effort: Pulmonary effort is normal. No respiratory distress.     Breath sounds: Normal breath sounds.  Abdominal:     Palpations: Abdomen is soft.     Tenderness: There is no abdominal tenderness.  Musculoskeletal:     Cervical back: Neck supple.  Skin:    General: Skin is warm and dry.  Neurological:     Mental Status: He is alert.    ED Results / Procedures / Treatments   Labs (all labs ordered are listed, but only abnormal results are displayed) Labs Reviewed  COMPREHENSIVE METABOLIC PANEL - Abnormal; Notable for the following  components:      Result Value   Glucose, Bld 106 (*)    BUN 25 (*)    Creatinine, Ser 1.37 (*)    All other components within normal limits  CBC  BRAIN NATRIURETIC PEPTIDE  D-DIMER, QUANTITATIVE  TROPONIN I (HIGH SENSITIVITY)  TROPONIN I (HIGH SENSITIVITY)    EKG EKG Interpretation  Date/Time:  Thursday March 12 2021 01:26:55 EDT Ventricular Rate:  94 PR Interval:  148 QRS Duration: 90 QT Interval:  336 QTC Calculation: 421 R Axis:   -61 Text Interpretation: Sinus rhythm LAD, consider left anterior fascicular block Confirmed by Keyira Mondesir ([id number]) on 03/12/2021 12:12:14 PM  Radiology DG Chest 2 View  Result Date: 03/12/2021 CLINICAL DATA:  Chest pain EXAM: CHEST - 2 VIEW COMPARISON:  08/09/2020 FINDINGS: Low lung volumes, bibasilar atelectasis. Heart size is borderline, likely accentuated by the low volumes. No effusions or acute bony abnormality. IMPRESSION: Low lung volumes, bibasilar atelectasis. Electronically Signed   By: Charlett Nose M.D.   On: 03/12/2021 02:36    Procedures Procedures   Medications Ordered in ED Medications  acetaminophen (TYLENOL) tablet 1,000 mg (1,000 mg Oral Given 03/12/21 7341)    ED Course  I have reviewed the triage vital signs and the nursing notes.  Pertinent labs & imaging results that were available during my care of the patient were reviewed by me and considered in my medical decision making (see chart for details).    MDM Rules/Calculators/A&P                           Patient seen in the emergency department for evaluation of chest pain.  Physical exam largely unremarkable.  Initial laboratory evaluation with a creatinine elevation of 1.37 which is baseline for this patient but is otherwise unremarkable.  Negative D-dimer, negative BNP, negative troponin and delta troponin.  ECG nonischemic.  Patient was given a gram of Tylenol and states that his pain is significantly better.  He was able to lie flat without orthopnea.  Patient  with a heart score of 3.  Risk of Mace 0.9 to 1.7% and admission was offered to the patient for stress testing, but due to the patient's significant improvement in symptoms, using shared decision-making, we decided that the patient will be discharged and if his symptoms persist he was call his primary care physician to schedule outpatient cardiology.  I had a long discussion about return precautions with the patient that included new or worsening chest pain, persistent orthopnea, exertional shortness of breath, diaphoresis, unexplained nausea or vomiting which she voiced understanding.  Patient then discharged with outpatient follow-up. Final Clinical Impression(s) /  ED Diagnoses Final diagnoses:  Atypical chest pain    Rx / DC Orders ED Discharge Orders     None        Amora Sheehy, Wyn Forster, MD 03/12/21 1214

## 2021-03-12 NOTE — ED Triage Notes (Signed)
Pt reports right sided chest pains x 3 days at rest. Pain began while at rest in the passengers seat. Describes pain as an "internal bruising sensation".

## 2022-09-01 IMAGING — CT CT ANGIO CHEST
2 of 6 series · 18 of 36 positions shown · IV contrast (OMNIPAQUE 350)
Comparison: April 02, 2019.

CLINICAL DATA: Positive D-dimer.

EXAM:
CT ANGIOGRAPHY CHEST WITH CONTRAST
TECHNIQUE: Multidetector CT imaging of the chest was performed using the
standard protocol during bolus administration of intravenous
contrast. Multiplanar CT image reconstructions and MIPs were
obtained to evaluate the vascular anatomy.
CONTRAST:  100mL OMNIPAQUE IOHEXOL 350 MG/ML SOLN

[Series 5: thins · axial · 0.79mm/px · z∈[+1330,+1562]mm · 17 of 262 slices shown]
[im 15/262  lung]
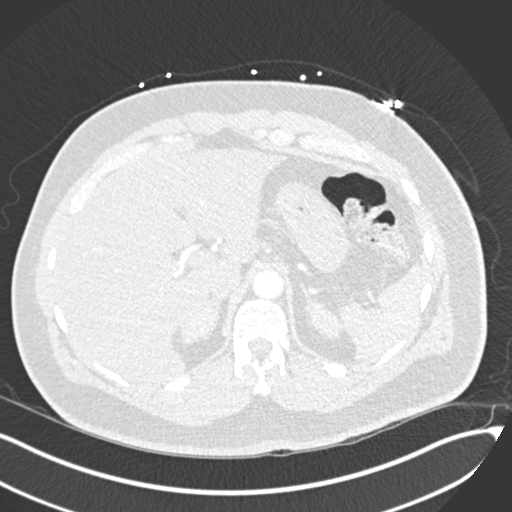
[im 30/262  mediastinal]
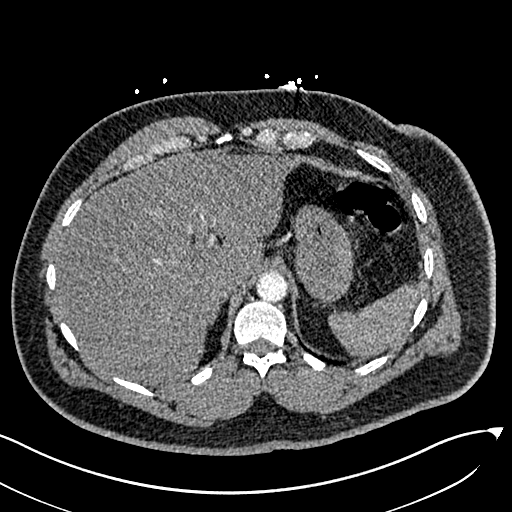
[im 44/262  lung]
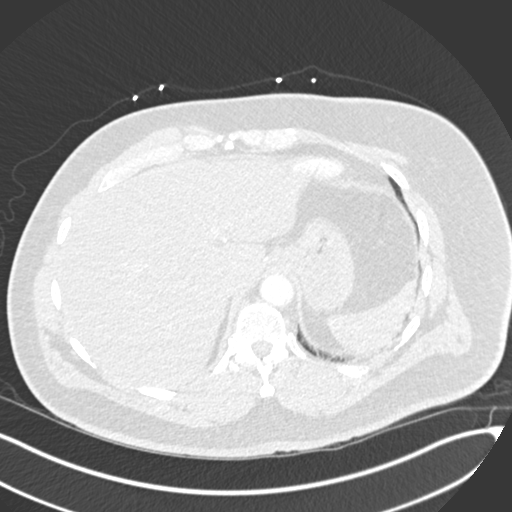
[im 59/262  mediastinal]
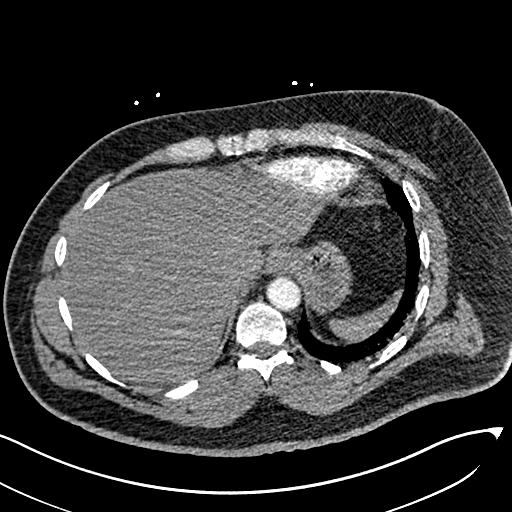
[im 73/262  lung]
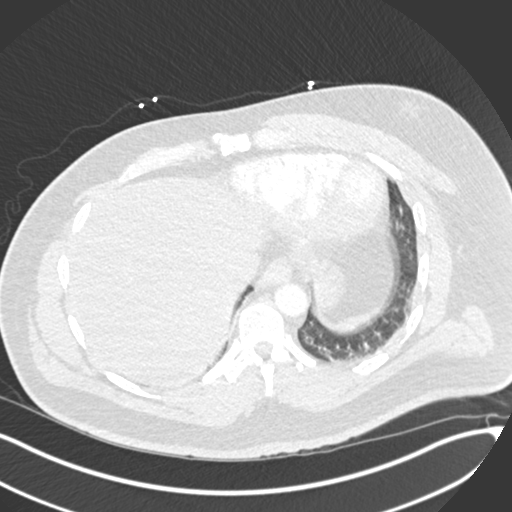
[im 88/262  mediastinal]
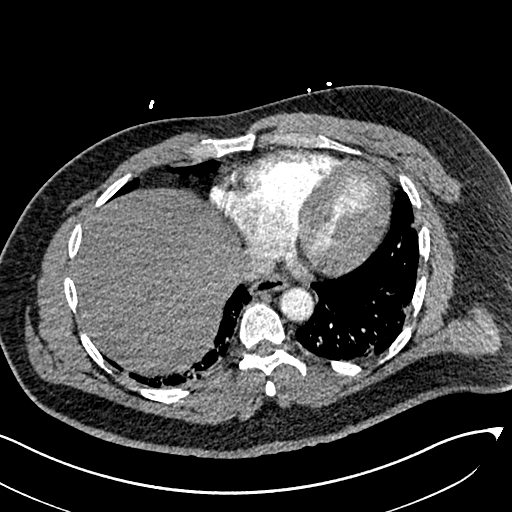
[im 102/262  lung]
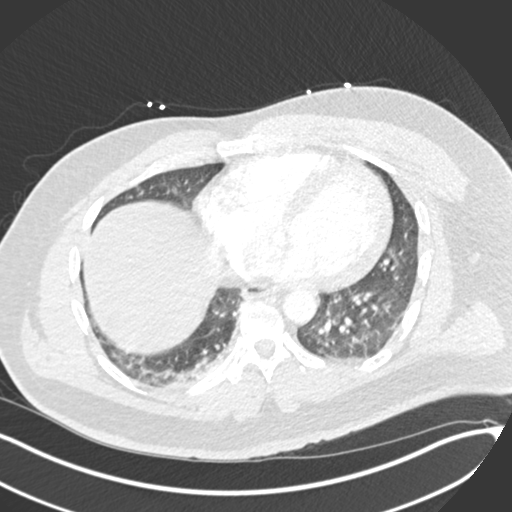
[im 117/262  mediastinal]
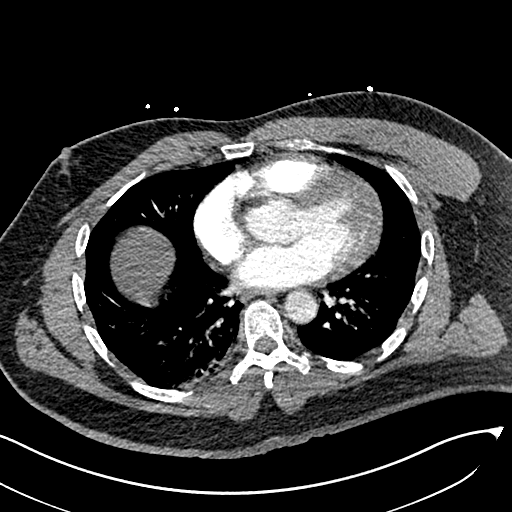
[im 131/262  lung]
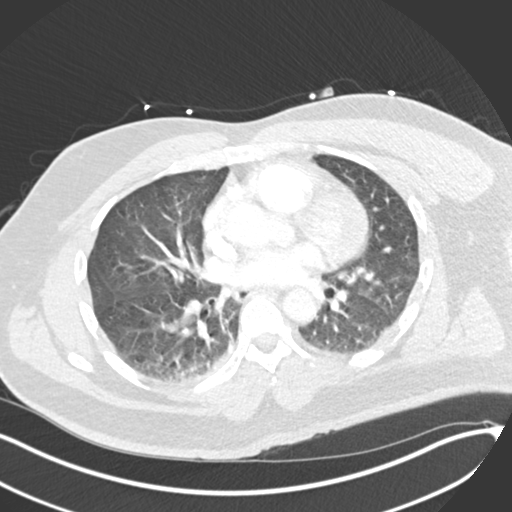
[im 146/262  mediastinal]
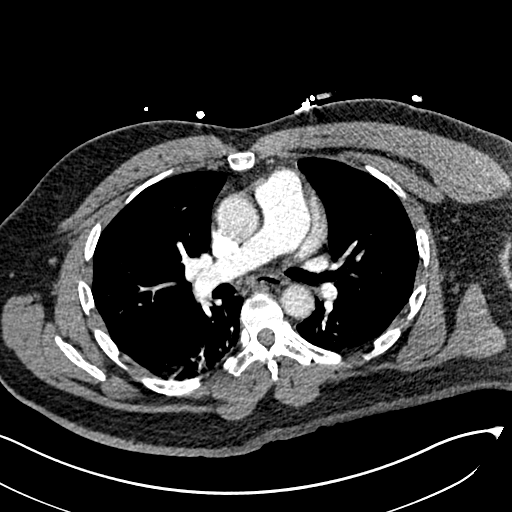
[im 160/262  lung]
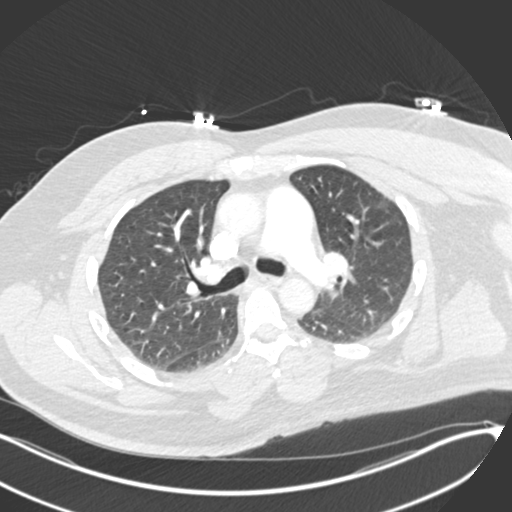
[im 175/262  mediastinal]
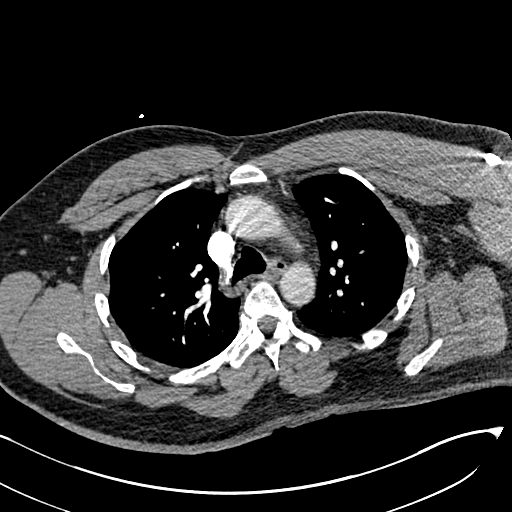
[im 189/262  lung]
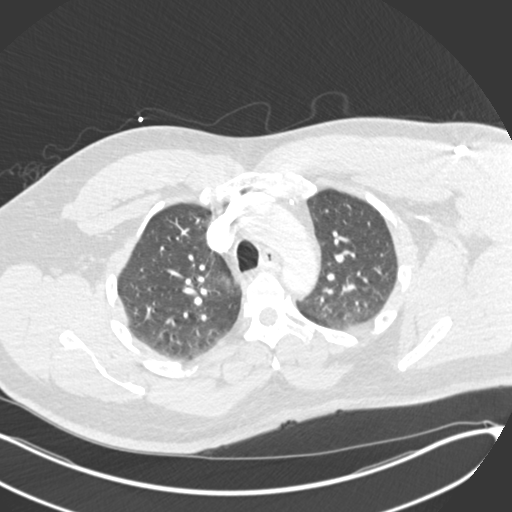
[im 204/262  mediastinal]
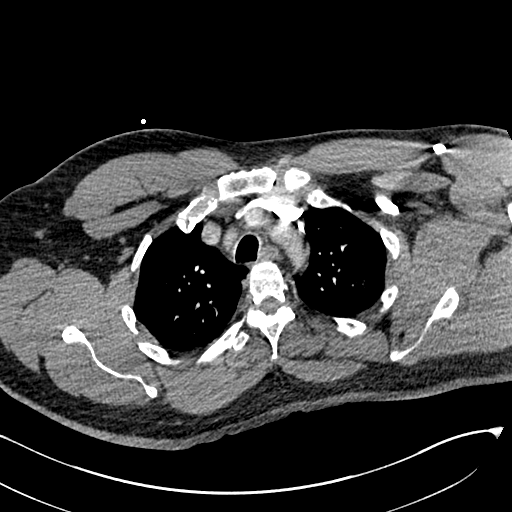
[im 218/262  lung]
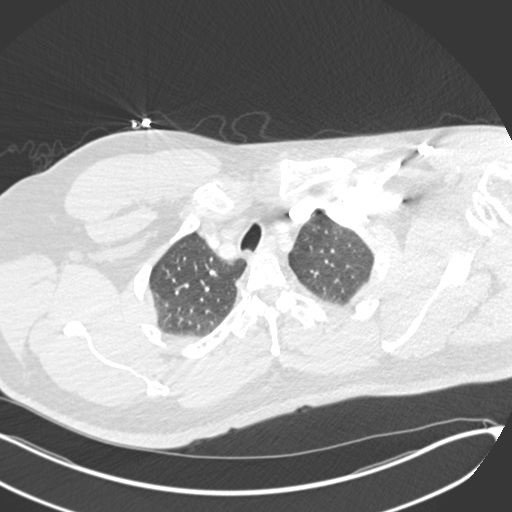
[im 233/262  mediastinal]
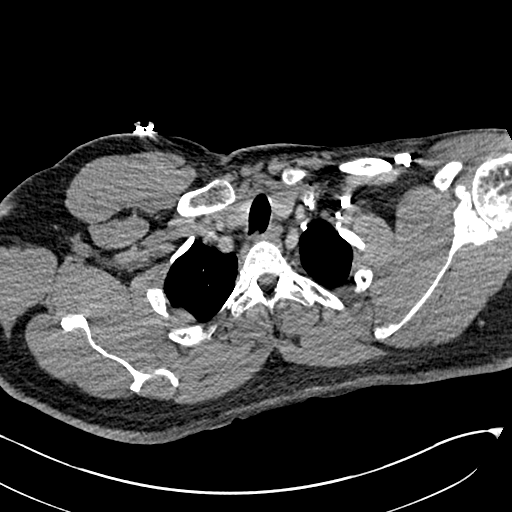
[im 247/262  lung]
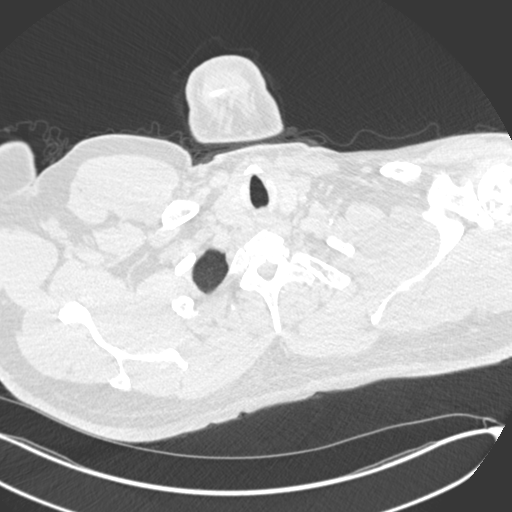

[Series 7: coronal mpr · coronal · 0.56mm/px · 1 of 151 slices shown]
[im 76/151  mediastinal]
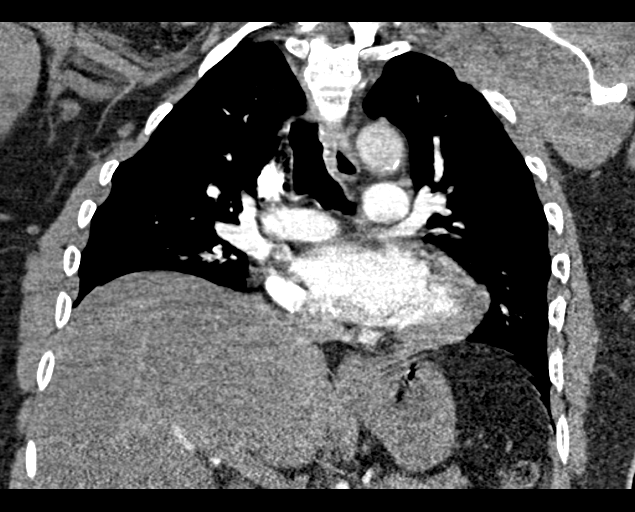

[18 of 36 positions shown; findings below may reference images not displayed]

FINDINGS: Cardiovascular: Satisfactory opacification of the pulmonary arteries
to the segmental level. No evidence of pulmonary embolism. Normal
heart size. No pericardial effusion.

Mediastinum/Nodes: No enlarged mediastinal, hilar, or axillary lymph
nodes. Thyroid gland, trachea, and esophagus demonstrate no
significant findings.

Lungs/Pleura: No pneumothorax or pleural effusion is noted. Minimal
bibasilar subsegmental atelectasis is noted.

Upper Abdomen: Hepatic steatosis.

Musculoskeletal: No chest wall abnormality. No acute or significant
osseous findings.

Review of the MIP images confirms the above findings.
IMPRESSION: 1. No definite evidence of pulmonary embolus.
2. Minimal bibasilar subsegmental atelectasis.
3. Hepatic steatosis.

## 2023-04-04 IMAGING — CR DG CHEST 2V
2 series · 2 of 2 positions shown · non-contrast
Comparison: 08/09/2020

CLINICAL DATA: Chest pain

EXAM:
CHEST - 2 VIEW

[w chest pa]
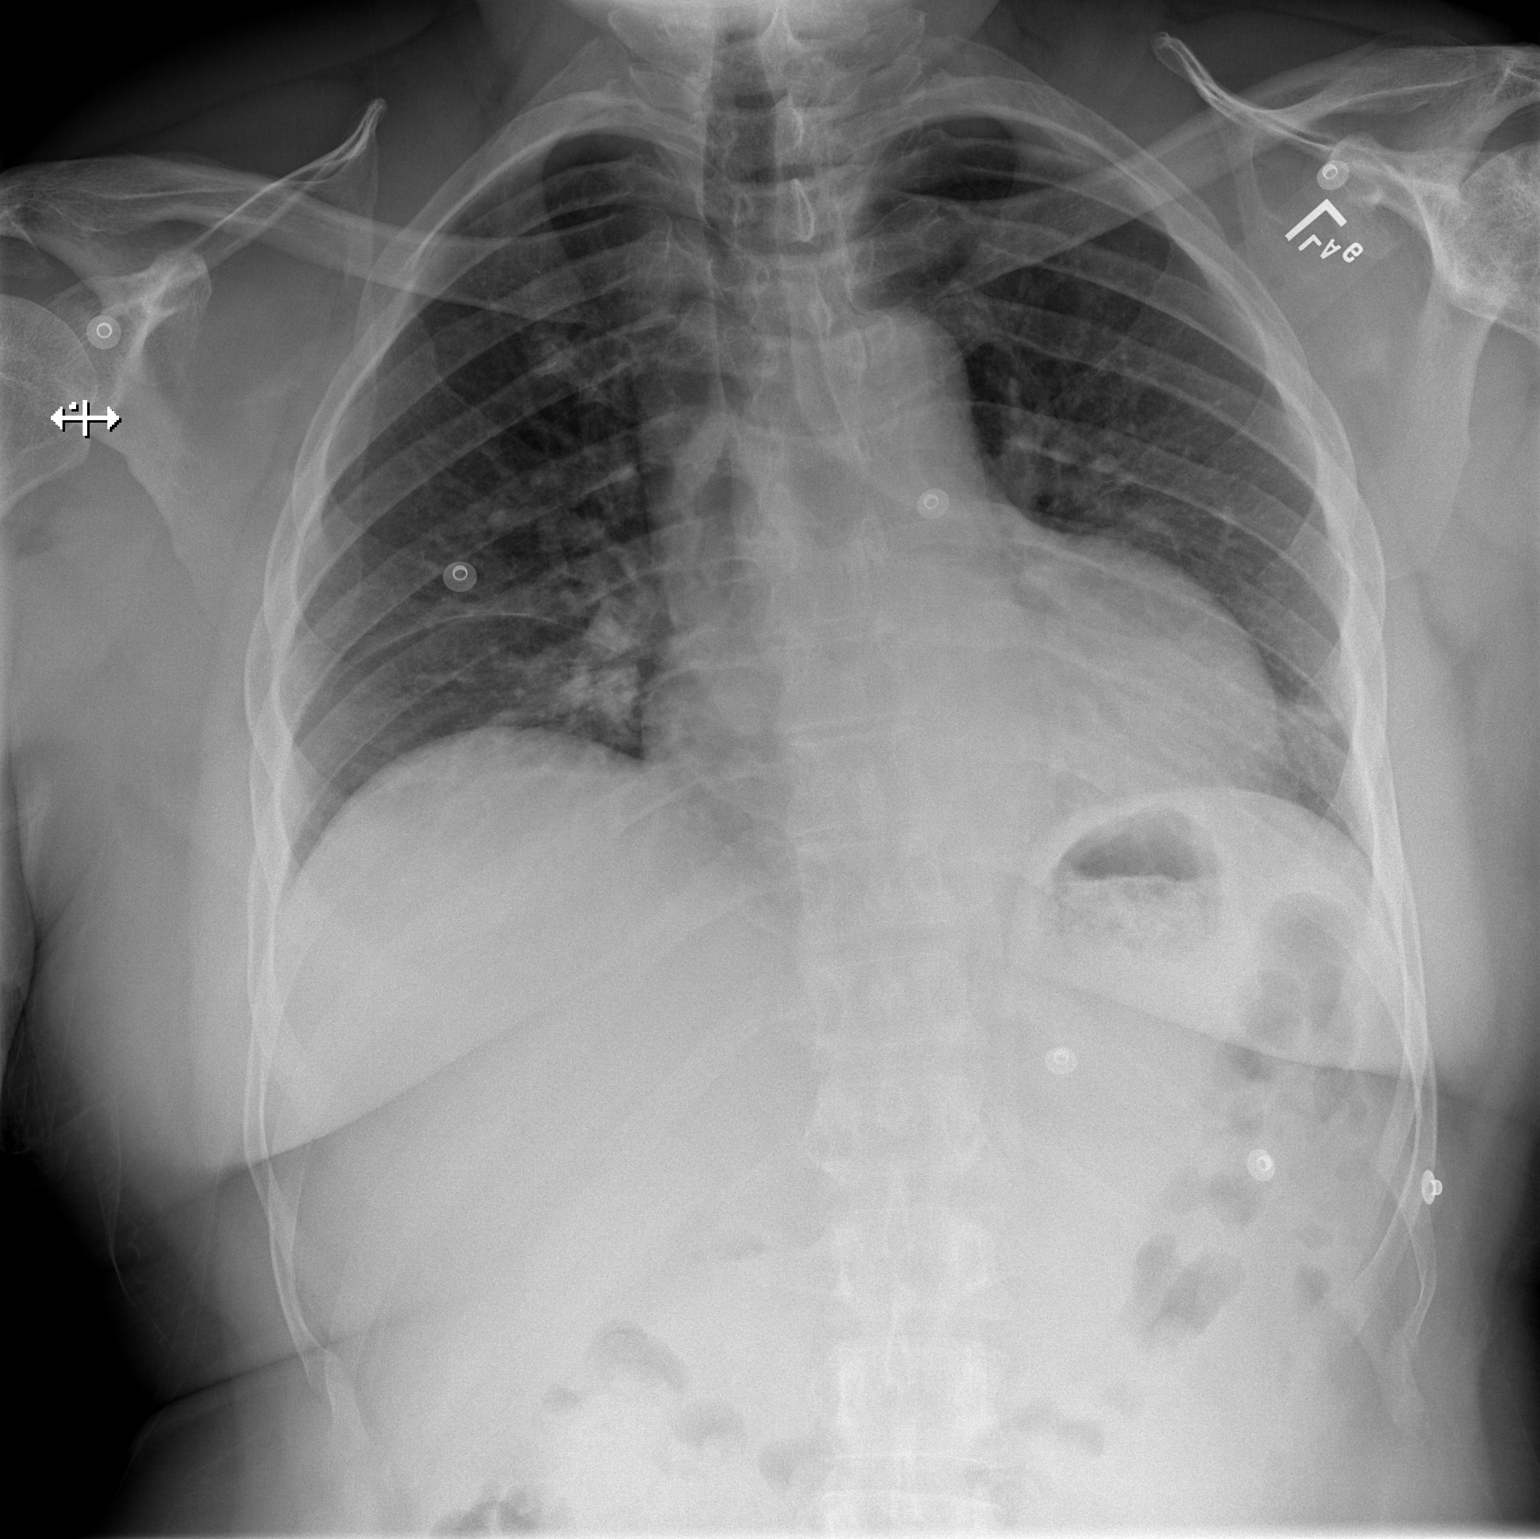

[w chest lat]
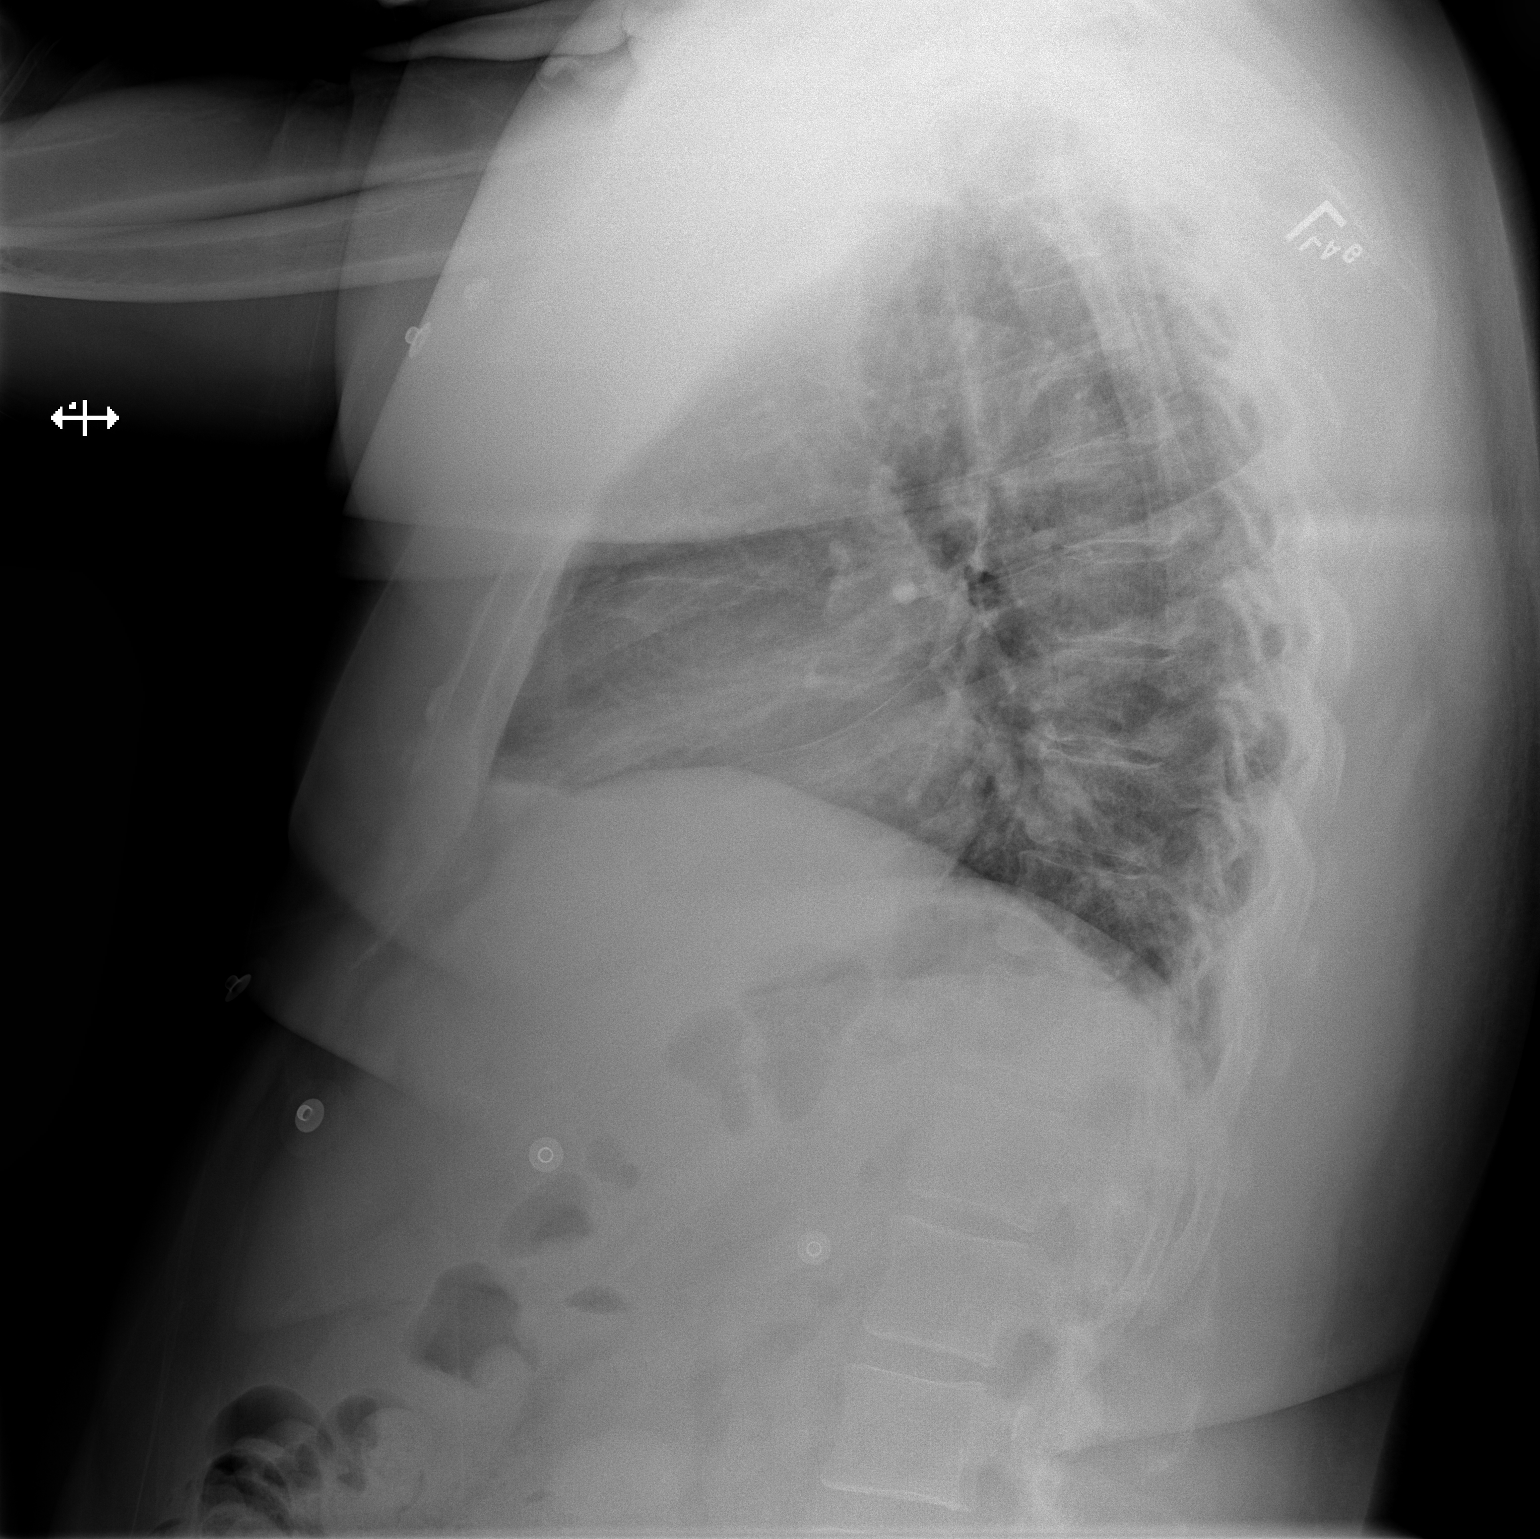

[2 of 2 positions shown; findings below may reference images not displayed]

FINDINGS: Low lung volumes, bibasilar atelectasis. Heart size is borderline,
likely accentuated by the low volumes. No effusions or acute bony
abnormality.
IMPRESSION: Low lung volumes, bibasilar atelectasis.

## 2024-01-20 ENCOUNTER — Encounter: Payer: Self-pay | Admitting: Advanced Practice Midwife

## 2024-08-09 ENCOUNTER — Emergency Department (HOSPITAL_COMMUNITY)

## 2024-08-09 ENCOUNTER — Observation Stay (HOSPITAL_COMMUNITY): Admission: EM | Admit: 2024-08-09 | Source: Home / Self Care | Attending: Student | Admitting: Student

## 2024-08-09 ENCOUNTER — Other Ambulatory Visit: Payer: Self-pay

## 2024-08-09 ENCOUNTER — Encounter (HOSPITAL_COMMUNITY): Payer: Self-pay

## 2024-08-09 DIAGNOSIS — M1A9XX0 Chronic gout, unspecified, without tophus (tophi): Secondary | ICD-10-CM

## 2024-08-09 DIAGNOSIS — E872 Acidosis, unspecified: Secondary | ICD-10-CM

## 2024-08-09 DIAGNOSIS — E119 Type 2 diabetes mellitus without complications: Secondary | ICD-10-CM | POA: Diagnosis not present

## 2024-08-09 DIAGNOSIS — Z6832 Body mass index (BMI) 32.0-32.9, adult: Secondary | ICD-10-CM

## 2024-08-09 DIAGNOSIS — E1310 Other specified diabetes mellitus with ketoacidosis without coma: Principal | ICD-10-CM

## 2024-08-09 DIAGNOSIS — E661 Drug-induced obesity: Secondary | ICD-10-CM

## 2024-08-09 DIAGNOSIS — E1110 Type 2 diabetes mellitus with ketoacidosis without coma: Secondary | ICD-10-CM | POA: Diagnosis not present

## 2024-08-09 DIAGNOSIS — K219 Gastro-esophageal reflux disease without esophagitis: Secondary | ICD-10-CM | POA: Diagnosis not present

## 2024-08-09 DIAGNOSIS — E66811 Obesity, class 1: Secondary | ICD-10-CM | POA: Diagnosis not present

## 2024-08-09 LAB — COMPREHENSIVE METABOLIC PANEL WITH GFR
ALT: 47 U/L — ABNORMAL HIGH (ref 0–44)
AST: 17 U/L (ref 15–41)
Albumin: 5 g/dL (ref 3.5–5.0)
Alkaline Phosphatase: 118 U/L (ref 38–126)
BUN: 41 mg/dL — ABNORMAL HIGH (ref 6–20)
CO2: <7 mmol/L — ABNORMAL LOW (ref 22–32)
Calcium: 11.6 mg/dL — ABNORMAL HIGH (ref 8.9–10.3)
Chloride: 80 mmol/L — ABNORMAL LOW (ref 98–111)
Creatinine, Ser: 2.22 mg/dL — ABNORMAL HIGH (ref 0.61–1.24)
GFR, Estimated: 34 mL/min — ABNORMAL LOW
Glucose, Bld: 810 mg/dL (ref 70–99)
Potassium: 5.4 mmol/L — ABNORMAL HIGH (ref 3.5–5.1)
Sodium: 130 mmol/L — ABNORMAL LOW (ref 135–145)
Total Bilirubin: 0.5 mg/dL (ref 0.0–1.2)
Total Protein: 9.4 g/dL — ABNORMAL HIGH (ref 6.5–8.1)

## 2024-08-09 LAB — LIPASE, BLOOD: Lipase: 79 U/L — ABNORMAL HIGH (ref 11–51)

## 2024-08-09 LAB — BASIC METABOLIC PANEL WITH GFR
BUN: 45 mg/dL — ABNORMAL HIGH (ref 6–20)
CO2: <7 mmol/L — ABNORMAL LOW (ref 22–32)
Calcium: 10.3 mg/dL (ref 8.9–10.3)
Chloride: 86 mmol/L — ABNORMAL LOW (ref 98–111)
Creatinine, Ser: 2.16 mg/dL — ABNORMAL HIGH (ref 0.61–1.24)
GFR, Estimated: 35 mL/min — ABNORMAL LOW
Glucose, Bld: 763 mg/dL (ref 70–99)
Potassium: 4.4 mmol/L (ref 3.5–5.1)
Sodium: 133 mmol/L — ABNORMAL LOW (ref 135–145)

## 2024-08-09 LAB — I-STAT VENOUS BLOOD GAS, ED
Acid-base deficit: 20 mmol/L — ABNORMAL HIGH (ref 0.0–2.0)
Bicarbonate: 6.2 mmol/L — ABNORMAL LOW (ref 20.0–28.0)
Calcium, Ion: 1.01 mmol/L — ABNORMAL LOW (ref 1.15–1.40)
HCT: 55 % — ABNORMAL HIGH (ref 39.0–52.0)
Hemoglobin: 18.7 g/dL — ABNORMAL HIGH (ref 13.0–17.0)
O2 Saturation: 87 %
Potassium: 5.2 mmol/L — ABNORMAL HIGH (ref 3.5–5.1)
Sodium: 126 mmol/L — ABNORMAL LOW (ref 135–145)
TCO2: 7 mmol/L — ABNORMAL LOW (ref 22–32)
pCO2, Ven: 17.2 mmHg — CL (ref 44–60)
pH, Ven: 7.167 — CL (ref 7.25–7.43)
pO2, Ven: 64 mmHg — ABNORMAL HIGH (ref 32–45)

## 2024-08-09 LAB — CBG MONITORING, ED
Glucose-Capillary: 443 mg/dL — ABNORMAL HIGH (ref 70–99)
Glucose-Capillary: 474 mg/dL — ABNORMAL HIGH (ref 70–99)
Glucose-Capillary: 479 mg/dL — ABNORMAL HIGH (ref 70–99)
Glucose-Capillary: 532 mg/dL (ref 70–99)
Glucose-Capillary: 535 mg/dL (ref 70–99)
Glucose-Capillary: 557 mg/dL (ref 70–99)
Glucose-Capillary: >600 mg/dL (ref 70–99)
Glucose-Capillary: >600 mg/dL (ref 70–99)
Glucose-Capillary: >600 mg/dL (ref 70–99)
Glucose-Capillary: >600 mg/dL (ref 70–99)
Glucose-Capillary: >600 mg/dL (ref 70–99)
Glucose-Capillary: >600 mg/dL (ref 70–99)
Glucose-Capillary: >600 mg/dL (ref 70–99)

## 2024-08-09 LAB — URINALYSIS, ROUTINE W REFLEX MICROSCOPIC
Bacteria, UA: NONE SEEN
Bilirubin Urine: NEGATIVE
Glucose, UA: >=500 mg/dL — AB
Ketones, ur: 20 mg/dL — AB
Leukocytes,Ua: NEGATIVE
Nitrite: NEGATIVE
Protein, ur: NEGATIVE mg/dL
Specific Gravity, Urine: 1.02 (ref 1.005–1.030)
pH: 5 (ref 5.0–8.0)

## 2024-08-09 LAB — HEMOGLOBIN A1C
Hgb A1c MFr Bld: 12.1 % — ABNORMAL HIGH (ref 4.8–5.6)
Mean Plasma Glucose: 300.57 mg/dL

## 2024-08-09 LAB — CBC WITH DIFFERENTIAL/PLATELET
Abs Immature Granulocytes: 0.18 10*3/uL — ABNORMAL HIGH (ref 0.00–0.07)
Basophils Absolute: 0.1 10*3/uL (ref 0.0–0.1)
Basophils Relative: 0 %
Eosinophils Absolute: 0 10*3/uL (ref 0.0–0.5)
Eosinophils Relative: 0 %
HCT: 57.6 % — ABNORMAL HIGH (ref 39.0–52.0)
Hemoglobin: 19.3 g/dL — ABNORMAL HIGH (ref 13.0–17.0)
Immature Granulocytes: 2 %
Lymphocytes Relative: 12 %
Lymphs Abs: 1.3 10*3/uL (ref 0.7–4.0)
MCH: 30.1 pg (ref 26.0–34.0)
MCHC: 33.5 g/dL (ref 30.0–36.0)
MCV: 89.9 fL (ref 80.0–100.0)
Monocytes Absolute: 0.5 10*3/uL (ref 0.1–1.0)
Monocytes Relative: 5 %
Neutro Abs: 9.4 10*3/uL — ABNORMAL HIGH (ref 1.7–7.7)
Neutrophils Relative %: 81 %
Platelets: 336 10*3/uL (ref 150–400)
RBC: 6.41 MIL/uL — ABNORMAL HIGH (ref 4.22–5.81)
RDW: 13.5 % (ref 11.5–15.5)
WBC: 11.5 10*3/uL — ABNORMAL HIGH (ref 4.0–10.5)
nRBC: 0 % (ref 0.0–0.2)

## 2024-08-09 LAB — BETA-HYDROXYBUTYRIC ACID
Beta-Hydroxybutyric Acid: >8 mmol/L — ABNORMAL HIGH (ref 0.05–0.27)
Beta-Hydroxybutyric Acid: 6.56 mmol/L — ABNORMAL HIGH (ref 0.05–0.27)

## 2024-08-09 LAB — TROPONIN T, HIGH SENSITIVITY
Troponin T High Sensitivity: 10 ng/L (ref 0–19)
Troponin T High Sensitivity: 12 ng/L (ref 0–19)

## 2024-08-09 MED ORDER — STERILE WATER FOR INJECTION IJ SOLN
INTRAMUSCULAR | Status: AC
  Filled 2024-08-09: qty 10

## 2024-08-09 MED ORDER — ALUM & MAG HYDROXIDE-SIMETH 200-200-20 MG/5ML PO SUSP
30.0000 mL | Freq: Once | ORAL | Status: AC
  Administered 2024-08-09: 30 mL via ORAL
  Filled 2024-08-09: qty 30

## 2024-08-09 MED ORDER — FEBUXOSTAT 40 MG PO TABS
80.0000 mg | ORAL_TABLET | Freq: Every day | ORAL | Status: AC
  Administered 2024-08-10: 80 mg via ORAL
  Filled 2024-08-09: qty 2

## 2024-08-09 MED ORDER — ACETAMINOPHEN 325 MG PO TABS
650.0000 mg | ORAL_TABLET | Freq: Four times a day (QID) | ORAL | Status: AC | PRN
  Administered 2024-08-10: 650 mg via ORAL
  Filled 2024-08-09: qty 2

## 2024-08-09 MED ORDER — DEXTROSE IN LACTATED RINGERS 5 % IV SOLN: INTRAVENOUS | Status: DC

## 2024-08-09 MED ORDER — LACTATED RINGERS IV SOLN: INTRAVENOUS | Status: DC

## 2024-08-09 MED ORDER — SODIUM CHLORIDE 0.9 % IV BOLUS
1000.0000 mL | Freq: Once | INTRAVENOUS | Status: AC
  Administered 2024-08-09: 1000 mL via INTRAVENOUS

## 2024-08-09 MED ORDER — ONDANSETRON HCL 4 MG/2ML IJ SOLN
4.0000 mg | Freq: Once | INTRAMUSCULAR | Status: AC
  Administered 2024-08-09: 4 mg via INTRAVENOUS
  Filled 2024-08-09: qty 2

## 2024-08-09 MED ORDER — BISACODYL 5 MG PO TBEC: 5.0000 mg | DELAYED_RELEASE_TABLET | Freq: Every day | ORAL | Status: AC | PRN

## 2024-08-09 MED ORDER — FENTANYL CITRATE (PF) 50 MCG/ML IJ SOSY
25.0000 ug | PREFILLED_SYRINGE | Freq: Once | INTRAMUSCULAR | Status: AC
  Administered 2024-08-09: 25 ug via INTRAVENOUS
  Filled 2024-08-09: qty 1

## 2024-08-09 MED ORDER — PANTOPRAZOLE SODIUM 40 MG IV SOLR
40.0000 mg | Freq: Once | INTRAVENOUS | Status: AC
  Administered 2024-08-09: 40 mg via INTRAVENOUS
  Filled 2024-08-09: qty 10

## 2024-08-09 MED ORDER — ACETAMINOPHEN 650 MG RE SUPP: 650.0000 mg | Freq: Four times a day (QID) | RECTAL | Status: AC | PRN

## 2024-08-09 MED ORDER — ALBUTEROL SULFATE (2.5 MG/3ML) 0.083% IN NEBU: 2.5000 mg | INHALATION_SOLUTION | Freq: Four times a day (QID) | RESPIRATORY_TRACT | Status: AC | PRN

## 2024-08-09 MED ORDER — SODIUM BICARBONATE 8.4 % IV SOLN
100.0000 meq | Freq: Once | INTRAVENOUS | Status: AC
  Administered 2024-08-09: 100 meq via INTRAVENOUS
  Filled 2024-08-09: qty 50

## 2024-08-09 MED ORDER — HYDROMORPHONE HCL 1 MG/ML IJ SOLN: 0.5000 mg | Freq: Four times a day (QID) | INTRAMUSCULAR | Status: AC | PRN

## 2024-08-09 MED ORDER — PANTOPRAZOLE SODIUM 40 MG PO TBEC
40.0000 mg | DELAYED_RELEASE_TABLET | Freq: Every day | ORAL | Status: DC
  Administered 2024-08-10: 40 mg via ORAL
  Filled 2024-08-09: qty 1

## 2024-08-09 MED ORDER — LACTATED RINGERS IV BOLUS
1000.0000 mL | Freq: Once | INTRAVENOUS | Status: AC
  Administered 2024-08-09: 1000 mL via INTRAVENOUS

## 2024-08-09 MED ORDER — FAMOTIDINE 20 MG PO TABS
20.0000 mg | ORAL_TABLET | Freq: Every day | ORAL | Status: AC
  Administered 2024-08-10: 20 mg via ORAL
  Filled 2024-08-09: qty 1

## 2024-08-09 MED ORDER — LACTATED RINGERS IV BOLUS
20.0000 mL/kg | Freq: Once | INTRAVENOUS | Status: AC
  Administered 2024-08-09: 2178 mL via INTRAVENOUS

## 2024-08-09 MED ORDER — DEXTROSE 50 % IV SOLN: 0.0000 mL | INTRAVENOUS | Status: AC | PRN

## 2024-08-09 MED ORDER — INSULIN REGULAR(HUMAN) IN NACL 100-0.9 UT/100ML-% IV SOLN
INTRAVENOUS | Status: DC
  Administered 2024-08-09: 9.5 [IU]/h via INTRAVENOUS
  Administered 2024-08-10: 10 [IU]/h via INTRAVENOUS
  Filled 2024-08-09 (×2): qty 100

## 2024-08-09 MED ORDER — ENOXAPARIN SODIUM 40 MG/0.4ML IJ SOSY
40.0000 mg | PREFILLED_SYRINGE | INTRAMUSCULAR | Status: AC
  Administered 2024-08-10: 40 mg via SUBCUTANEOUS
  Filled 2024-08-09: qty 0.4

## 2024-08-09 MED ORDER — LIDOCAINE VISCOUS HCL 2 % MT SOLN
15.0000 mL | Freq: Once | OROMUCOSAL | Status: AC
  Administered 2024-08-09: 15 mL via ORAL
  Filled 2024-08-09: qty 15

## 2024-08-09 MED ORDER — ONDANSETRON HCL 4 MG/2ML IJ SOLN: 4.0000 mg | Freq: Four times a day (QID) | INTRAMUSCULAR | Status: AC | PRN

## 2024-08-09 MED ORDER — SENNOSIDES-DOCUSATE SODIUM 8.6-50 MG PO TABS: 1.0000 | ORAL_TABLET | Freq: Every evening | ORAL | Status: AC | PRN

## 2024-08-09 MED ORDER — ONDANSETRON HCL 4 MG PO TABS: 4.0000 mg | ORAL_TABLET | Freq: Four times a day (QID) | ORAL | Status: AC | PRN

## 2024-08-09 NOTE — ED Provider Triage Note (Signed)
 Emergency Medicine Provider Triage Evaluation Note  Patrick Hodges , a 54 y.o. male  was evaluated in triage.  Pt complains of right sided abdominal pain, near syncope, weakness, nausea, vomiting. He is here with his fiance who assist with the history.  Patient started semaglutide about 1 month ago.  Most of the symptoms are new over the past few days, worsening today.  Review of Systems  Positive: Abdominal pain Negative: Complete syncope  Physical Exam  BP (!) 135/101   Pulse (!) 127   Resp 20   Ht 1.829 m (6')   Wt 108.9 kg   SpO2 98%   BMI 32.55 kg/m  Gen:   Awake, no distress adult male speaking clearly Resp:  Normal effort  MSK:   Moves extremities without difficulty no deformity Other:  Neuro grossly intact  Medical Decision Making  Medically screening exam initiated at 12:39 PM.  Appropriate orders placed.  Lowanda DELENA Ada was informed that the remainder of the evaluation will be completed by another provider, this initial triage assessment does not replace that evaluation, and the importance of remaining in the ED until their evaluation is complete.   Garrick Charleston, MD 08/09/24 1240

## 2024-08-09 NOTE — H&P (Signed)
 " History and Physical  Patrick Hodges FMW:[id number] DOB: 04-16-1971 DOA: 08/09/2024  PCP: Marvene Prentice SAUNDERS, FNP   Chief Complaint: Blurry vision, lethargy and epigastric pain  HPI: Patrick Hodges is a 54 y.o. male with medical history significant for class II obesity, prediabetes and gout who presents to the ED for evaluation of blurry vision, lethargy and epigastric pain. Patient reports that 1 month ago, he started semaglutide (semaglutide/Gly/B12 3 mg /5 mg/0.5 mg/mL, inject 0.3 mL/0 units weekly) for weight loss via a telemedicine visit through his insurance company. About 2 weeks ago, he started having blurry vision with increased fatigue. He also reports associated polydipsia, polyuria, nausea and vomiting. Over the last few days, his symptoms have worsened with significant fatigue, epigastric pain with reflux and recent slurring of his speech. Due to concern for possible stroke, his fiance advised him to come to the ED for further evaluation. He denies any focal weakness, numbness, tingling, chest pain, shortness of breath, fevers or chills. He denies any history of diabetes. Patient did have A1c of 5.8% (prediabetes range) in November 2020 and has had significant weight gain over the last few years.  ED Course: Initial vitals show patient afebrile, RR 17-37, HR 100-1 20s, SBP 110-140s, SpO2 100% on room air. Initial labs significant for sodium 130, chloride 80, K+ 5.4, bicarb <7, glucose 810, BUN 41/2.22, calcium  11.6, lipase 79, WBC 11.5, Hgb 19.3, BHB >8.0, VBG with pH 7.17, pCO2 17.2, pO2 64, bicarb 6.2. CXR shows no active disease. CT head with no acute intracranial abnormality. Pt received IV fentanyl , IV LR boluses, Maalox/Mylanta and Xylocaine  solution, IV Protonix  and IV Zofran . [facility] was consulted for admission.   Review of Systems: Please see HPI for pertinent positives and negatives. A complete 10 system review of systems are otherwise negative.  Past Medical History:  Diagnosis  Date   Gout    Past Surgical History:  Procedure Laterality Date   TONSILLECTOMY     Social History:  reports that he has been smoking cigarettes. He has never used smokeless tobacco. He reports current alcohol use. He reports that he does not use drugs.  Allergies[1]  Family History  Problem Relation Age of Onset   Diabetes Mellitus II Neg Hx      Prior to Admission medications  Medication Sig Start Date End Date Taking? Authorizing Provider  Febuxostat  80 MG TABS Take 1 tablet by mouth daily.   Yes [provider]  indomethacin  (INDOCIN ) 50 MG capsule Take 50 mg by mouth 3 (three) times daily as needed for moderate pain (pain score 4-6) or mild pain (pain score 1-3). 07/04/24  Yes [provider]  pantoprazole  (PROTONIX ) 40 MG tablet Take 40 mg by mouth daily.   Yes [provider]  tadalafil (CIALIS) 5 MG tablet Take 5 mg by mouth daily as needed.   Yes [provider]  amoxicillin-clavulanate (AUGMENTIN) 875-125 MG tablet Take 1 tablet by mouth 2 (two) times daily. Patient not taking: Reported on 08/09/2024 07/04/24   [provider]  NONFORMULARY OR COMPOUNDED ITEM Inject 0.3 mg into the skin once a week. Semaglutide/Gly/B12 3MG /5MG /0.5MG /ML Injection 07/12/24   [provider]    Physical Exam: BP 119/86   Pulse (!) 120   Temp 98.5 F (36.9 C) (Oral)   Resp (!) 22   Ht 6' (1.829 m)   Wt 108.9 kg   SpO2 100%   BMI 32.55 kg/m  General: Lethargic appearing obese man laying in bed. No  acute distress. HEENT: [place]/AT. Anicteric sclera. Cracked lips with dry mucous membrane CV: Tachycardic. Regular rhythm. No murmurs, rubs, or gallops. No LE edema Pulmonary: Mild tachypnea. Lungs CTAB. Normal effort. No wheezing or rales. Abdominal: Soft. Mild epigastric tenderness. Normal bowel sounds. Extremities: Palpable radial and DP pulses. Normal ROM. Skin: Warm and dry. No obvious rash or lesions. Decreased skin turgor. Neuro:  Somnolent but easily arousable. Oriented x 3.  Speech is comprehensible but slightly dysarthric. Moves all extremities. Normal sensation to light touch. No focal deficit. Psych: Normal mood and affect          Labs on Admission:  Basic Metabolic Panel: Recent Labs  Lab 08/09/24 1241 08/09/24 1523 08/09/24 1745  NA 130* 126* 133*  K 5.4* 5.2* 4.4  CL 80*  --  86*  CO2 <7*  --  <7*  GLUCOSE 810*  --  763*  BUN 41*  --  45*  CREATININE 2.22*  --  2.16*  CALCIUM  11.6*  --  10.3   Liver Function Tests: Recent Labs  Lab 08/09/24 1241  AST 17  ALT 47*  ALKPHOS 118  BILITOT 0.5  PROT 9.4*  ALBUMIN 5.0   Recent Labs  Lab 08/09/24 1241  LIPASE 79*   No results for input(s): AMMONIA in the last 168 hours. CBC: Recent Labs  Lab 08/09/24 1241 08/09/24 1523  WBC 11.5*  --   NEUTROABS 9.4*  --   HGB 19.3* 18.7*  HCT 57.6* 55.0*  MCV 89.9  --   PLT 336  --    Cardiac Enzymes: No results for input(s): CKTOTAL, CKMB, CKMBINDEX, TROPONINI in the last 168 hours. BNP (last 3 results) No results for input(s): BNP in the last 8760 hours.  ProBNP (last 3 results) No results for input(s): PROBNP in the last 8760 hours.  CBG: Recent Labs  Lab 08/09/24 1836 08/09/24 1913 08/09/24 2004 08/09/24 2038 08/09/24 2115  GLUCAP >600* >600* >600* 557* 532*    Radiological Exams on Admission: CT Head Wo Contrast Result Date: 08/09/2024 EXAM: CT HEAD WITHOUT CONTRAST 08/09/2024 05:05:00 PM TECHNIQUE: CT of the head was performed without the administration of intravenous contrast. Automated exposure control, iterative reconstruction, and/or weight based adjustment of the mA/kV was utilized to reduce the radiation dose to as low as reasonably achievable. COMPARISON: None available. CLINICAL HISTORY: Mental status change, unknown cause. FINDINGS: BRAIN AND VENTRICLES: No acute hemorrhage. No evidence of acute infarct. No hydrocephalus. No extra-axial collection. No mass  effect or midline shift. SINUSES: Near-complete opacification of the right frontal sinus. Mucosal thickening and secretions in the left frontal sinus. Moderate mucosal thickening in the ethmoid sinuses, left greater than right. Circumferential mild to moderate mucosal thickening in the left sphenoid sinus. Complete opacification of the visualized portions of the right maxillary sinus. Additional mucosal thickening in the left maxillary sinus. SOFT TISSUES AND SKULL: No acute soft tissue abnormality. No skull fracture. IMPRESSION: 1. No acute intracranial abnormality. 2. Extensive paranasal sinus mucosal thickening as above, greatest in the right maxillary sinus. Electronically signed by: Donnice Mania MD 08/09/2024 05:17 PM EST RP Workstation: [id number]   DG Chest 2 View Result Date: 08/09/2024 CLINICAL DATA:  Near syncope EXAM: CHEST - 2 VIEW COMPARISON:  March 12, 2021 FINDINGS: The heart size and mediastinal contours are within normal limits. Both lungs are clear. The visualized skeletal structures are unremarkable. IMPRESSION: No active cardiopulmonary disease. Electronically Signed   By: Lynwood Landy Raddle M.D.   On: 08/09/2024 13:22  EKG: My independent interpretation is: Sinus tach with peaked T waves  Assessment/Plan Patrick Hodges is a 54 y.o. male with medical history significant for significant for class II obesity, prediabetes and gout who presents to the ED for evaluation of blurry vision, lethargy and epigastric pain and admitted for DKA.  # DKA - Presented with 1 week of worsening lethargy, blurry vision, N/V, epigastric pain, polydipsia and polyuria - Found to have significantly elevated A1c of 12.1% with hyperglycemia (glucose 810), AGMA and ketonemia/ketonuria - S/p LR bolus and initiation of insulin  drip in ED - Pt quite lethargic with mild slurring of the speech but oriented x 3 - Continue insulin  drip - LR @ 125 mL/hr until CBG less than 250 - Switch to D5-LR when 1 CBG less  than 250 - Keep NPO except with sips with ice and meds - BMP Q4H, CBG Q1H, BHB Q8H - Once anion gap closed 2, start CM diet and if able to eat, administer Semglee  15 units - Continue insulin  drip for 1-2 more hours, then discontinue and start SSI-S  - DC fluids if eating, drinking, and off insulin  drip  # New onset type 2 diabetes - His last A1c was 5.8% 5 years ago however patient has had significant weight gain over the last few years. - Repeat A1c elevated to 12.1% after presenting in DKA - Patient likely with undiagnosed diabetes over the last few years as he has not had any repeat A1c since 2020 - Diabetic coordinator consulted for diabetes resources, counseling, and insulin  at discharge - Patient counseled and educated about new diagnosis of diabetes and recommendation to follow-up with PCP in the outpatient for medication adjustments  # AGMA - Bicarb significant low at <7, anion gap not calculated due to significant low bicarb - Give IV sodium bicarb 100 mEq x 1 - Follow-up repeat BMPs  # GERD - Reports worsening acid reflux over the last few days likely in the setting of DKA - Continue Protonix  and add famotidine   # Gout - Continue febuxostat   # Class II obesity Body mass index is 32.55 kg/m. Filed Weights   08/09/24 1239  Weight: 108.9 kg  - F/u with PCP for weight lost and nutrition counseling - Advised to discontinue compounded semaglutide  DVT prophylaxis: Lovenox     Code Status: Full Code  Consults called: None  Family Communication: Discussed results/findings and plan of admission with fianc at bedside  Severity of Illness: The appropriate patient status for this patient is OBSERVATION. Observation status is judged to be reasonable and necessary in order to provide the required intensity of service to ensure the patient's safety. The patient's presenting symptoms, physical exam findings, and initial radiographic and laboratory data in the context of their  medical condition is felt to place them at decreased risk for further clinical deterioration. Furthermore, it is anticipated that the patient will be medically stable for discharge from the hospital within 2 midnights of admission.   Level of care: Progressive    Lou Claretta HERO, MD 08/09/2024, 9:23 PM [facility] Pager: [phone] Isaiah 41:10   If 7PM-7AM, please contact night-coverage [email or web address] Password [email or web address]     [1]  Allergies Allergen Reactions   Diclofenac Sodium Other (See Comments)    nightmares   Allopurinol  Rash and Other (See Comments)    Blistering rash   "

## 2024-08-09 NOTE — Evaluation (Signed)
 RT Evaluate and Treat Note  08/09/2024   Breathing is (select one): Worse than normal   The following was found on auscultation (select multiple):                Cough Assessment:      Most Recent Chest Xray:... (DG Chest 2 View Result Date: 08/09/2024 CLINICAL DATA:  Near syncope EXAM: CHEST - 2 VIEW COMPARISON:  March 12, 2021 FINDINGS: The heart size and mediastinal contours are within normal limits. Both lungs are clear. The visualized skeletal structures are unremarkable. IMPRESSION: No active cardiopulmonary disease. Electronically Signed   By: Lynwood Landy Raddle M.D.   On: 08/09/2024 13:22      The following medications and/or interventions were ordered/changed/discontinued as part of the Respiratory Treatment protocol:   Medication Changes: Albuterol  Ordered   Airway Clearance Changes: Cough & Deep Breath  Ordered     Oxygen Therapy Changes:Cannula Ordered   Tachypnea due to DKA. No home regimen. Xray clear

## 2024-08-09 NOTE — ED Provider Notes (Signed)
 Supervised resident visit.  Patient here with lightheadedness fatigue.  He is already had blood work done prior to her evaluation is consistent with DKA.  He has been on semaglutide for weight loss does not have diabetes history but sure enough his lab work consistent with that with a glucose of 810.  Bicarb undetectable.  pH is 7.167.  EKG is unremarkable.  Troponin normal.  He is having what I think is mostly reflux pain as well from throwing out.  No evidence of UTI or pneumonia.  Will admit him to hospitalist for further DKA care.  Placed on insulin  protocol.  This chart was dictated using voice recognition software.  Despite best efforts to proofread,  errors can occur which can change the documentation meaning.  .Critical Care  Performed by: Ruthe Cornet, DO Authorized by: Ruthe Cornet, DO   Critical care provider statement:    Critical care time (minutes):  35   Critical care was necessary to treat or prevent imminent or life-threatening deterioration of the following conditions:  Endocrine crisis   Critical care was time spent personally by me on the following activities:  Development of treatment plan with patient or surrogate, blood draw for specimens, discussions with consultants, evaluation of patient's response to treatment, examination of patient, obtaining history from patient or surrogate, ordering and performing treatments and interventions, ordering and review of laboratory studies, ordering and review of radiographic studies, pulse oximetry, re-evaluation of patient's condition and review of old charts   Care discussed with: admitting provider       Ruthe Cornet, DO 08/09/24 1558

## 2024-08-09 NOTE — ED Triage Notes (Signed)
 C/O blurred vision a week and a half ago. Denies headaches.

## 2024-08-09 NOTE — ED Triage Notes (Signed)
 BIB family from home for side effects of weight loss medication/ injection. Sx gradually progressively worse over last 1-2 weeks. Endorses fatigue, near-syncope, NV, epigastric pain, and excessive thirst. Pt alert, NAD, calm, interactive, resps e/u, speaking in clear complete sentences. Walking with assistance.

## 2024-08-09 NOTE — ED Provider Notes (Signed)
 " [place] [facility] Provider Note   CSN: [id number] Arrival date & time: 08/09/24  1203     Patient presents with: Near Syncope   Patrick Hodges is a 54 y.o. male with past medical history notable for gout as well as GERD who presents today for evaluation of blurry vision, lethargy, abdominal pain.  Patient reports that he started semaglutide approximately a month ago primarily for weight loss.  He does not have a prior history of diabetes or hyperglycemia.  Patient reports this and starting his medication he has had worsening overall symptoms as above including worsening lethargy and fatigue.  For the past week he has had acute worsening of symptoms now with flank and abdominal discomfort with associated nausea, worsening blurry vision.  Family is also noted that he has had slurred speech.  Otherwise without any focal neurologic deficits such as unilateral weakness, sensory changes.  Due to ongoing symptoms presented today for further evaluation.   Near Syncope       Prior to Admission medications  Medication Sig Start Date End Date Taking? Authorizing Provider  allopurinol  (ZYLOPRIM ) 300 MG tablet Take 150 mg by mouth daily. 05/09/19   [provider]  colchicine  0.6 MG tablet 2 tabs po x 1, then one tab po 1 hour later 02/10/21   Mound, Haley E, FNP  febuxostat  (ULORIC ) 40 MG tablet Take 40 mg by mouth daily. 03/03/21   [provider]    Allergies: Allopurinol , Diclofenac sodium, and Other    Review of Systems  Cardiovascular:  Positive for near-syncope.    Updated Vital Signs BP (!) 135/101   Pulse (!) 127   Resp 20   Ht 6' (1.829 m)   Wt 108.9 kg   SpO2 98%   BMI 32.55 kg/m   Physical Exam Constitutional:      Appearance: He is ill-appearing.  HENT:     Head: Normocephalic and atraumatic.     Right Ear: External ear normal.     Left Ear: External ear normal.     Nose: Nose normal.     Mouth/Throat:      Mouth: Mucous membranes are moist.  Eyes:     Pupils: Pupils are equal, round, and reactive to light.  Cardiovascular:     Rate and Rhythm: Tachycardia present.     Pulses: Normal pulses.  Pulmonary:     Effort: Pulmonary effort is normal.     Comments: Tachypnea Abdominal:     General: Abdomen is flat.     Palpations: Abdomen is soft.     Tenderness: There is abdominal tenderness. There is no guarding or rebound.  Musculoskeletal:        General: Normal range of motion.     Cervical back: Normal range of motion.  Skin:    General: Skin is warm.     Capillary Refill: Capillary refill takes less than 2 seconds.  Neurological:     General: No focal deficit present.     Mental Status: He is alert and oriented to person, place, and time.     Comments: Mild dysarthria otherwise without any focal deficits  Psychiatric:        Mood and Affect: Mood normal.     (all labs ordered are listed, but only abnormal results are displayed) Labs Reviewed  COMPREHENSIVE METABOLIC PANEL WITH GFR - Abnormal; Notable for the following components:      Result Value   Sodium 130 (*)  Potassium 5.4 (*)    Chloride 80 (*)    CO2 <7 (*)    Glucose, Bld 810 (*)    BUN 41 (*)    Creatinine, Ser 2.22 (*)    Calcium  11.6 (*)    Total Protein 9.4 (*)    ALT 47 (*)    GFR, Estimated 34 (*)    All other components within normal limits  LIPASE, BLOOD - Abnormal; Notable for the following components:   Lipase 79 (*)    All other components within normal limits  CBC WITH DIFFERENTIAL/PLATELET - Abnormal; Notable for the following components:   WBC 11.5 (*)    RBC 6.41 (*)    Hemoglobin 19.3 (*)    HCT 57.6 (*)    Neutro Abs 9.4 (*)    Abs Immature Granulocytes 0.18 (*)    All other components within normal limits  URINALYSIS, ROUTINE W REFLEX MICROSCOPIC - Abnormal; Notable for the following components:   APPearance HAZY (*)    Glucose, UA >=500 (*)    Hgb urine dipstick SMALL (*)    Ketones,  ur 20 (*)    All other components within normal limits  BETA-HYDROXYBUTYRIC ACID  I-STAT VENOUS BLOOD GAS, ED  TROPONIN T, HIGH SENSITIVITY  TROPONIN T, HIGH SENSITIVITY    EKG: EKG Interpretation Date/Time:  Thursday August 09 2024 13:10:44 EST Ventricular Rate:  126 PR Interval:  146 QRS Duration:  84 QT Interval:  316 QTC Calculation: 457 R Axis:   258  Text Interpretation: Sinus tachycardia Right superior axis deviation Pulmonary disease pattern Abnormal ECG When compared with ECG of 12-Mar-2021 01:26, PREVIOUS ECG IS PRESENT Confirmed by Ruthe Cornet [phone]) on 08/09/2024 3:02:35 PM  Radiology: [facility] Chest 2 View Result Date: 08/09/2024 CLINICAL DATA:  Near syncope EXAM: CHEST - 2 VIEW COMPARISON:  March 12, 2021 FINDINGS: The heart size and mediastinal contours are within normal limits. Both lungs are clear. The visualized skeletal structures are unremarkable. IMPRESSION: No active cardiopulmonary disease. Electronically Signed   By: Lynwood Landy Raddle M.D.   On: 08/09/2024 13:22    Procedures   Medications Ordered in the ED  fentaNYL  (SUBLIMAZE ) injection 25 mcg (25 mcg Intravenous Given 08/09/24 1354)  ondansetron  (ZOFRAN ) injection 4 mg (4 mg Intravenous Given 08/09/24 1353)  sodium chloride  0.9 % bolus 1,000 mL (1,000 mLs Intravenous New Bag/Given 08/09/24 1353)    Medical Decision Making Amount and/or Complexity of Data Reviewed Labs: ordered. Radiology: ordered.  Risk OTC drugs. Prescription drug management. Decision regarding hospitalization.   Patient is a 54 year old male who presents today for evaluation of worsening altered mental status, nausea vomiting as well as blurry vision in the setting of recently starting semaglutide.  On initial assessment patient was noted to be tachycardic with heart rates in the 120s but otherwise hemodynamically stable and afebrile.  On my bedside assessment patient was ill-appearing.  Appears fatigued with overall slurred speech  however otherwise answering questions appropriately.  No obvious focal neurodeficits on my examination cranial nerves without any obvious abnormalities.  Generalized abdominal discomfort without any rigidity, rebound or guarding.  At the time my assessment patient already had laboratory evaluation that has resulted including metabolic panel concerning for significant hyperglycemia with glucose in the 800s with associated metabolic acidosis as well as AKI.  Mild elevation in lipase as well as urine with evidence of glucosuria and ketonuria.  Acidosis on VBG as well as undetectably high beta hydroxybutyrate.  CT head without any acute findings.  Current presentation concerning for DKA.  Unclear provoking factor at this point in time.  No evidence of infection or infarction.  Patient A1c is elevated at 12.1 so seems that there is a component of chronically uncontrolled hyperglycemia and underlying diabetes.  Patient was provided insulin  drip as well as IV fluid resuscitation.  Patient was also admitted to the hospital service.  Final diagnoses:  Diabetic ketoacidosis without coma associated with other specified diabetes mellitus [facility])    ED Discharge Orders     None          Laurita Sieving, MD 08/09/24 JEROLYN Ruthe Cornet, DO 08/09/24 2329  "

## 2024-08-10 ENCOUNTER — Telehealth (HOSPITAL_COMMUNITY): Payer: Self-pay

## 2024-08-10 ENCOUNTER — Other Ambulatory Visit (HOSPITAL_COMMUNITY): Payer: Self-pay

## 2024-08-10 LAB — BETA-HYDROXYBUTYRIC ACID
Beta-Hydroxybutyric Acid: 2.3 mmol/L — ABNORMAL HIGH (ref 0.05–0.27)
Beta-Hydroxybutyric Acid: 1.73 mmol/L — ABNORMAL HIGH (ref 0.05–0.27)
Beta-Hydroxybutyric Acid: 3.02 mmol/L — ABNORMAL HIGH (ref 0.05–0.27)

## 2024-08-10 LAB — BASIC METABOLIC PANEL WITH GFR
Anion gap: 28 — ABNORMAL HIGH (ref 5–15)
Anion gap: 16 — ABNORMAL HIGH (ref 5–15)
Anion gap: 11 (ref 5–15)
Anion gap: 11 (ref 5–15)
BUN: 40 mg/dL — ABNORMAL HIGH (ref 6–20)
BUN: 30 mg/dL — ABNORMAL HIGH (ref 6–20)
BUN: 24 mg/dL — ABNORMAL HIGH (ref 6–20)
BUN: 19 mg/dL (ref 6–20)
CO2: 14 mmol/L — ABNORMAL LOW (ref 22–32)
CO2: 22 mmol/L (ref 22–32)
CO2: 26 mmol/L (ref 22–32)
CO2: 22 mmol/L (ref 22–32)
Calcium: 9.5 mg/dL (ref 8.9–10.3)
Calcium: 9.2 mg/dL (ref 8.9–10.3)
Calcium: 9.1 mg/dL (ref 8.9–10.3)
Calcium: 7.5 mg/dL — ABNORMAL LOW (ref 8.9–10.3)
Chloride: 91 mmol/L — ABNORMAL LOW (ref 98–111)
Chloride: 96 mmol/L — ABNORMAL LOW (ref 98–111)
Chloride: 97 mmol/L — ABNORMAL LOW (ref 98–111)
Chloride: 103 mmol/L (ref 98–111)
Creatinine, Ser: 1.78 mg/dL — ABNORMAL HIGH (ref 0.61–1.24)
Creatinine, Ser: 1.41 mg/dL — ABNORMAL HIGH (ref 0.61–1.24)
Creatinine, Ser: 1.25 mg/dL — ABNORMAL HIGH (ref 0.61–1.24)
Creatinine, Ser: 1 mg/dL (ref 0.61–1.24)
GFR, Estimated: 44 mL/min — ABNORMAL LOW
GFR, Estimated: 59 mL/min — ABNORMAL LOW
GFR, Estimated: >60 mL/min
GFR, Estimated: >60 mL/min
Glucose, Bld: 492 mg/dL — ABNORMAL HIGH (ref 70–99)
Glucose, Bld: 231 mg/dL — ABNORMAL HIGH (ref 70–99)
Glucose, Bld: 224 mg/dL — ABNORMAL HIGH (ref 70–99)
Glucose, Bld: 269 mg/dL — ABNORMAL HIGH (ref 70–99)
Potassium: 4.2 mmol/L (ref 3.5–5.1)
Potassium: 4.1 mmol/L (ref 3.5–5.1)
Potassium: 3.7 mmol/L (ref 3.5–5.1)
Potassium: 3.1 mmol/L — ABNORMAL LOW (ref 3.5–5.1)
Sodium: 133 mmol/L — ABNORMAL LOW (ref 135–145)
Sodium: 133 mmol/L — ABNORMAL LOW (ref 135–145)
Sodium: 134 mmol/L — ABNORMAL LOW (ref 135–145)
Sodium: 137 mmol/L (ref 135–145)

## 2024-08-10 LAB — CBG MONITORING, ED
Glucose-Capillary: 145 mg/dL — ABNORMAL HIGH (ref 70–99)
Glucose-Capillary: 201 mg/dL — ABNORMAL HIGH (ref 70–99)
Glucose-Capillary: 215 mg/dL — ABNORMAL HIGH (ref 70–99)
Glucose-Capillary: 216 mg/dL — ABNORMAL HIGH (ref 70–99)
Glucose-Capillary: 218 mg/dL — ABNORMAL HIGH (ref 70–99)
Glucose-Capillary: 224 mg/dL — ABNORMAL HIGH (ref 70–99)
Glucose-Capillary: 226 mg/dL — ABNORMAL HIGH (ref 70–99)
Glucose-Capillary: 233 mg/dL — ABNORMAL HIGH (ref 70–99)
Glucose-Capillary: 251 mg/dL — ABNORMAL HIGH (ref 70–99)
Glucose-Capillary: 266 mg/dL — ABNORMAL HIGH (ref 70–99)
Glucose-Capillary: 274 mg/dL — ABNORMAL HIGH (ref 70–99)
Glucose-Capillary: 287 mg/dL — ABNORMAL HIGH (ref 70–99)
Glucose-Capillary: 294 mg/dL — ABNORMAL HIGH (ref 70–99)
Glucose-Capillary: 394 mg/dL — ABNORMAL HIGH (ref 70–99)
Glucose-Capillary: 447 mg/dL — ABNORMAL HIGH (ref 70–99)

## 2024-08-10 LAB — GLUCOSE, CAPILLARY: Glucose-Capillary: 349 mg/dL — ABNORMAL HIGH (ref 70–99)

## 2024-08-10 LAB — CBC
HCT: 42.5 % (ref 39.0–52.0)
Hemoglobin: 14.9 g/dL (ref 13.0–17.0)
MCH: 29.5 pg (ref 26.0–34.0)
MCHC: 35.1 g/dL (ref 30.0–36.0)
MCV: 84.2 fL (ref 80.0–100.0)
Platelets: 264 10*3/uL (ref 150–400)
RBC: 5.05 MIL/uL (ref 4.22–5.81)
RDW: 13.2 % (ref 11.5–15.5)
WBC: 14.2 10*3/uL — ABNORMAL HIGH (ref 4.0–10.5)
nRBC: 0 % (ref 0.0–0.2)

## 2024-08-10 LAB — HIV ANTIBODY (ROUTINE TESTING W REFLEX): HIV Screen 4th Generation wRfx: NONREACTIVE

## 2024-08-10 LAB — MAGNESIUM: Magnesium: 2.1 mg/dL (ref 1.7–2.4)

## 2024-08-10 MED ORDER — PANTOPRAZOLE SODIUM 40 MG PO TBEC
40.0000 mg | DELAYED_RELEASE_TABLET | Freq: Two times a day (BID) | ORAL | Status: AC
  Administered 2024-08-10: 40 mg via ORAL
  Filled 2024-08-10: qty 1

## 2024-08-10 MED ORDER — POTASSIUM CHLORIDE 20 MEQ PO PACK
40.0000 meq | PACK | ORAL | Status: AC
  Administered 2024-08-10 (×2): 40 meq via ORAL
  Filled 2024-08-10 (×2): qty 2

## 2024-08-10 MED ORDER — INSULIN ASPART 100 UNIT/ML IJ SOLN
0.0000 [IU] | INTRAMUSCULAR | Status: AC
  Administered 2024-08-10: 11 [IU] via SUBCUTANEOUS
  Administered 2024-08-10: 5 [IU] via SUBCUTANEOUS
  Filled 2024-08-10: qty 5
  Filled 2024-08-10: qty 1

## 2024-08-10 MED ORDER — CALCIUM CARBONATE ANTACID 500 MG PO CHEW
1.0000 | CHEWABLE_TABLET | Freq: Two times a day (BID) | ORAL | Status: AC | PRN
  Administered 2024-08-10: 200 mg via ORAL
  Filled 2024-08-10: qty 1

## 2024-08-10 MED ORDER — INSULIN STARTER KIT- PEN NEEDLES (ENGLISH)
1.0000 | Freq: Once | Status: AC
  Administered 2024-08-10: 1
  Filled 2024-08-10: qty 1

## 2024-08-10 MED ORDER — ALUM & MAG HYDROXIDE-SIMETH 200-200-20 MG/5ML PO SUSP
15.0000 mL | ORAL | Status: DC | PRN
  Administered 2024-08-10: 15 mL via ORAL
  Filled 2024-08-10: qty 30

## 2024-08-10 MED ORDER — SODIUM CHLORIDE 0.45 % IV SOLN
INTRAVENOUS | Status: AC
  Filled 2024-08-10: qty 1000

## 2024-08-10 MED ORDER — LIVING WELL WITH DIABETES BOOK
Freq: Once | Status: AC
  Filled 2024-08-10: qty 1

## 2024-08-10 MED ORDER — INSULIN GLARGINE-YFGN 100 UNIT/ML ~~LOC~~ SOLN
20.0000 [IU] | Freq: Every day | SUBCUTANEOUS | Status: AC
  Administered 2024-08-10: 20 [IU] via SUBCUTANEOUS
  Filled 2024-08-10 (×2): qty 0.2

## 2024-08-10 NOTE — Telephone Encounter (Signed)
 Pharmacy Patient Advocate Encounter  Received notification from [employer] that Prior Authorization for Dexcom G7 Sensor has been APPROVED from 08/10/24 to 08/10/25. Ran test claim, Copay is $0. This test claim was processed through [facility]- copay amounts may vary at other pharmacies due to pharmacy/plan contracts, or as the patient moves through the different stages of their insurance plan.   PA #/Case ID/Reference #: [id number]

## 2024-08-10 NOTE — Telephone Encounter (Signed)
 Pharmacy Patient Advocate Encounter  Insurance verification completed.    The patient is insured through [facility]. Patient has [employer], may use a copay card, and/or apply for patient assistance if available.    Ran test claim for Lantus  100unit pen and the current 30 day co-pay is $0.  Ran test claim for Novolog  100unit pen and the current 30 day co-pay is $0.  This test claim was processed through [employer]- copay amounts may vary at other pharmacies due to [employer], or as the patient moves through the different stages of their insurance plan.

## 2024-08-10 NOTE — Progress Notes (Signed)
 [facility] night cross cover note:   I was notified by the patient's RN that the patient is experiencing some epigastric discomfort with eating or drinking this evening, without significant relief following dose of Maalox/Mylanta via existing prn order and requesting additional pharmacologic intervention to help address this.  I subsequently changed existing order for Protonix  from daily to twice daily, with next dose to occur now, and replaced the existing order for prn Maalox/Mylanta with as needed Tums for heartburn, indigestion.  I also added a lipase level with morning labs.     Eva Pore, DO Hospitalist

## 2024-08-10 NOTE — Progress Notes (Signed)
 " PROGRESS NOTE    Patrick Hodges  FMW:[id number] DOB: [birth year] DOA: 08/09/2024 PCP: Marvene Prentice SAUNDERS, FNP  Outpatient Specialists:     Brief Narrative:  As per H&P done on presentation: Patrick Hodges is a 54 y.o. male with medical history significant for class II obesity, prediabetes and gout who presents to the ED for evaluation of blurry vision, lethargy and epigastric pain. Patient reports that 1 month ago, he started semaglutide (semaglutide/Gly/B12 3 mg /5 mg/0.5 mg/mL, inject 0.3 mL/0 units weekly) for weight loss via a telemedicine visit through his insurance company. About 2 weeks ago, he started having blurry vision with increased fatigue. He also reports associated polydipsia, polyuria, nausea and vomiting. Over the last few days, his symptoms have worsened with significant fatigue, epigastric pain with reflux and recent slurring of his speech. Due to concern for possible stroke, his fiance advised him to come to the ED for further evaluation. He denies any focal weakness, numbness, tingling, chest pain, shortness of breath, fevers or chills. He denies any history of diabetes. Patient did have A1c of 5.8% (prediabetes range) in November 2020 and has had significant weight gain over the last few years.   ED Course: Initial vitals show patient afebrile, RR 17-37, HR 100-1 20s, SBP 110-140s, SpO2 100% on room air. Initial labs significant for sodium 130, chloride 80, K+ 5.4, bicarb <7, glucose 810, BUN 41/2.22, calcium  11.6, lipase 79, WBC 11.5, Hgb 19.3, BHB >8.0, VBG with pH 7.17, pCO2 17.2, pO2 64, bicarb 6.2. CXR shows no active disease. CT head with no acute intracranial abnormality. Pt received IV fentanyl , IV LR boluses, Maalox/Mylanta and Xylocaine  solution, IV Protonix  and IV Zofran . TRH was consulted for admission.   08/10/2024: Patient seen alongside patient's partner.  DKA has resolved.  Will start transitioning patient.  Subcutaneous Lantus  22 units x 1 dose now, and then  discontinue insulin  drip 2 hours later.  Continue sliding scale insulin  coverage.   Assessment & Plan:   Principal Problem:   DKA (diabetic ketoacidosis) (HCC) Active Problems:   New onset type 2 diabetes mellitus (HCC)   Gastroesophageal reflux disease   Class 1 drug-induced obesity with serious comorbidity and body mass index (BMI) of 32.0 to 32.9 in adult   Chronic gout without tophus   Metabolic acidosis   DKA: - Resolved. - Continue to monitor and correct abnormal electrolytes. - Hydrate patient. - Sliding scale insulin  coverage. - Start subcutaneous Lantus  20 units once daily.  New onset diabetes mellitus: - A1c of 12.1%. - Diabetic teaching. - Subcutaneous Lantus . - Sliding scale insulin  coverage. -Start metformin 1000 mg p.o. twice daily in the next 1 to 2 days. - Check lipid profile. - Likely start patient on statin in a.m.  Acute kidney injury: - Resolved. - Likely prerenal.  Volume depletion: - Resolved.  Class I obesity: - Patient was on subcutaneous semaglutide prior to presentation. - Patient is not keen on continuing semaglutide. - Diet and exercise.  Hypokalemia: - Continue to monitor and replete. - Monitor magnesium level.  History of gout: - Continue febuxostat .   DVT prophylaxis: Subcutaneous Lovenox  Code Status: Full code Family Communication: Partner Disposition Plan: Discharge home eventually   Consultants:  None  Procedures:  None  Antimicrobials:  None   Subjective: No new complaint  Objective: Vitals:   08/10/24 0530 08/10/24 0535 08/10/24 0600 08/10/24 0701  BP: (!) 139/90  126/79 113/69  Pulse: (!) 102  99 95  Resp:    17  Temp:  97.7 F (36.5 C)    TempSrc:  Oral    SpO2: 95%  98% 100%  Weight:      Height:        Intake/Output Summary (Last 24 hours) at 08/10/2024 1228 Last data filed at 08/10/2024 1203 Gross per 24 hour  Intake 3070.9 ml  Output --  Net 3070.9 ml   Filed Weights   08/09/24 1239   Weight: 108.9 kg    Examination:  General exam: Appears calm and comfortable.  Patient is obese. Respiratory system: Clear to auscultation. Respiratory effort normal. Cardiovascular system: S1 & S2 heard. Gastrointestinal system: Abdomen is obese, soft and nontender.  Central nervous system: Awake and alert.   Extremities: No leg edema  Data Reviewed: I have personally reviewed following labs and imaging studies  CBC: Recent Labs  Lab 08/09/24 1241 08/09/24 1523 08/10/24 0417  WBC 11.5*  --  14.2*  NEUTROABS 9.4*  --   --   HGB 19.3* 18.7* 14.9  HCT 57.6* 55.0* 42.5  MCV 89.9  --  84.2  PLT 336  --  264   Basic Metabolic Panel: Recent Labs  Lab 08/09/24 1241 08/09/24 1523 08/09/24 1745 08/09/24 2206 08/10/24 0417 08/10/24 0748  NA 130* 126* 133* 133* 133* 134*  K 5.4* 5.2* 4.4 4.2 4.1 3.7  CL 80*  --  86* 91* 96* 97*  CO2 <7*  --  <7* 14* 22 26  GLUCOSE 810*  --  763* 492* 231* 224*  BUN 41*  --  45* 40* 30* 24*  CREATININE 2.22*  --  2.16* 1.78* 1.41* 1.25*  CALCIUM  11.6*  --  10.3 9.5 9.2 9.1   GFR: Estimated Creatinine Clearance: 85.1 mL/min (A) (by C-G formula based on SCr of 1.25 mg/dL (H)). Liver Function Tests: Recent Labs  Lab 08/09/24 1241  AST 17  ALT 47*  ALKPHOS 118  BILITOT 0.5  PROT 9.4*  ALBUMIN 5.0   Recent Labs  Lab 08/09/24 1241  LIPASE 79*   No results for input(s): AMMONIA in the last 168 hours. Coagulation Profile: No results for input(s): INR, PROTIME in the last 168 hours. Cardiac Enzymes: No results for input(s): CKTOTAL, CKMB, CKMBINDEX, TROPONINI in the last 168 hours. BNP (last 3 results) No results for input(s): PROBNP in the last 8760 hours. HbA1C: Recent Labs    08/09/24 1655  HGBA1C 12.1*   CBG: Recent Labs  Lab 08/10/24 0716 08/10/24 0843 08/10/24 1004 08/10/24 1057 08/10/24 1142  GLUCAP 216* 251* 218* 145* 226*   Lipid Profile: No results for input(s): CHOL, HDL, LDLCALC,  TRIG, CHOLHDL, LDLDIRECT in the last 72 hours. Thyroid Function Tests: No results for input(s): TSH, T4TOTAL, FREET4, T3FREE, THYROIDAB in the last 72 hours. Anemia Panel: No results for input(s): VITAMINB12, FOLATE, FERRITIN, TIBC, IRON, RETICCTPCT in the last 72 hours. Urine analysis:    Component Value Date/Time   COLORURINE YELLOW 08/09/2024 1241   APPEARANCEUR HAZY (A) 08/09/2024 1241   LABSPEC 1.020 08/09/2024 1241   PHURINE 5.0 08/09/2024 1241   GLUCOSEU >=500 (A) 08/09/2024 1241   HGBUR SMALL (A) 08/09/2024 1241   BILIRUBINUR NEGATIVE 08/09/2024 1241   KETONESUR 20 (A) 08/09/2024 1241   PROTEINUR NEGATIVE 08/09/2024 1241   UROBILINOGEN 1.0 10/16/2007 2052   NITRITE NEGATIVE 08/09/2024 1241   LEUKOCYTESUR NEGATIVE 08/09/2024 1241   Sepsis Labs: @LABRCNTIP (procalcitonin:4,lacticidven:4)  )No results found for this or any previous visit (from the past 240 hours).       Radiology Studies:  CT Head Wo Contrast Result Date: 08/09/2024 EXAM: CT HEAD WITHOUT CONTRAST 08/09/2024 05:05:00 PM TECHNIQUE: CT of the head was performed without the administration of intravenous contrast. Automated exposure control, iterative reconstruction, and/or weight based adjustment of the mA/kV was utilized to reduce the radiation dose to as low as reasonably achievable. COMPARISON: None available. CLINICAL HISTORY: Mental status change, unknown cause. FINDINGS: BRAIN AND VENTRICLES: No acute hemorrhage. No evidence of acute infarct. No hydrocephalus. No extra-axial collection. No mass effect or midline shift. SINUSES: Near-complete opacification of the right frontal sinus. Mucosal thickening and secretions in the left frontal sinus. Moderate mucosal thickening in the ethmoid sinuses, left greater than right. Circumferential mild to moderate mucosal thickening in the left sphenoid sinus. Complete opacification of the visualized portions of the right maxillary sinus. Additional  mucosal thickening in the left maxillary sinus. SOFT TISSUES AND SKULL: No acute soft tissue abnormality. No skull fracture. IMPRESSION: 1. No acute intracranial abnormality. 2. Extensive paranasal sinus mucosal thickening as above, greatest in the right maxillary sinus. Electronically signed by: Donnice Mania MD 08/09/2024 05:17 PM EST RP Workstation: [id number]   DG Chest 2 View Result Date: 08/09/2024 CLINICAL DATA:  Near syncope EXAM: CHEST - 2 VIEW COMPARISON:  March 12, 2021 FINDINGS: The heart size and mediastinal contours are within normal limits. Both lungs are clear. The visualized skeletal structures are unremarkable. IMPRESSION: No active cardiopulmonary disease. Electronically Signed   By: Lynwood Landy Raddle M.D.   On: 08/09/2024 13:22        Scheduled Meds:  enoxaparin  (LOVENOX ) injection  40 mg Subcutaneous Q24H   famotidine   20 mg Oral Daily   febuxostat   80 mg Oral Daily   insulin  aspart  0-15 Units Subcutaneous Q4H   insulin  glargine-yfgn  20 Units Subcutaneous Daily   insulin  starter kit- pen needles  1 kit Other Once   pantoprazole   40 mg Oral Daily   Continuous Infusions:  dextrose  5% lactated ringers  125 mL/hr at 08/10/24 9378   insulin  5.5 Units/hr (08/10/24 1203)   lactated ringers  Stopped (08/10/24 0520)     LOS: 0 days    Time spent: 55 minutes    Leatrice Chapel, MD  [facility] 7PM-7AM contact night coverage as above    "

## 2024-08-10 NOTE — Inpatient Diabetes Management (Signed)
 Inpatient Diabetes Program Recommendations  AACE/ADA: New Consensus Statement on Inpatient Glycemic Control (2015)  Target Ranges:  Prepandial:   less than 140 mg/dL      Peak postprandial:   less than 180 mg/dL (1-2 hours)      Critically ill patients:  140 - 180 mg/dL   Lab Results  Component Value Date   GLUCAP 251 (H) 08/10/2024   HGBA1C 12.1 (H) 08/09/2024    Latest Reference Range & Units 08/09/24 15:47 08/09/24 22:00 08/10/24 04:17  Beta-Hydroxybutyric Acid 0.05 - 0.27 mmol/L >8.00 (H) 6.56 (H) 2.30 (H)  (H): Data is abnormally high  Latest Reference Range & Units 08/09/24 12:41  Sodium 135 - 145 mmol/L 130 (L)  Potassium 3.5 - 5.1 mmol/L 5.4 (H)  Chloride 98 - 111 mmol/L 80 (L)  CO2 22 - 32 mmol/L <7 (L)  Glucose 70 - 99 mg/dL 189 (HH)  BUN 6 - 20 mg/dL 41 (H)  Creatinine 9.38 - 1.24 mg/dL 7.77 (H)  Calcium  8.9 - 10.3 mg/dL 88.3 (H)  Anion gap 5 - 15  NOT CALCULATED  (HH): Data is critically high (L): Data is abnormally low (H): Data is abnormally high  Latest Reference Range & Units 08/10/24 07:48  Sodium 135 - 145 mmol/L 134 (L)  Potassium 3.5 - 5.1 mmol/L 3.7  Chloride 98 - 111 mmol/L 97 (L)  CO2 22 - 32 mmol/L 26  Glucose 70 - 99 mg/dL 775 (H)  BUN 6 - 20 mg/dL 24 (H)  Creatinine 9.38 - 1.24 mg/dL 8.74 (H)  Calcium  8.9 - 10.3 mg/dL 9.1  Anion gap 5 - 15  11  (L): Data is abnormally low (H): Data is abnormally high  Diabetes history: Prediabetes Outpatient Diabetes medications: None Current orders for Inpatient glycemic control: IV insulin   Inpatient Diabetes Program Recommendations:   Spoke with pt and spouse about new diagnosis. Discussed A1C results with them and explained what an A1C is, basic pathophysiology of DM Type 2, basic home care, basic diabetes diet nutrition principles, importance of checking CBGs and maintaining good CBG control to prevent long-term and short-term complications. Reviewed signs and symptoms of hyperglycemia and hypoglycemia and  how to treat hypoglycemia at home. Also reviewed blood sugar goals at home.  RNs to provide ongoing basic DM education at bedside with this patient. Have ordered educational booklet, insulin  starter kit, and RD consult for DM diet education for this patient.   Educated patient and spouse on insulin  pen use at home. Reviewed contents of insulin  flexpen starter kit. Reviewed all steps of insulin  pen including attachment of needle, 2-unit air shot, dialing up dose, giving injection, removing needle, disposal of sharps, storage of unused insulin , disposal of insulin  etc. Patient able to provide successful return demonstration. Also reviewed troubleshooting with insulin  pen. MD to give patient Rxs for insulin  pens and insulin  pen needles.   MD ordered application of Freestyle CGM at discharge for patient. Education done regarding application and changing CGM sensor (alternate every 10 days on back of arms), 1 hour warm-up, use of glucometer when alert displays, how to scan CGM for glucose reading and information for PCP. Patient has also been given educational packet regarding use CGM sensor including the 1-800 toll free number for any questions, problems or needs related to the [facility] sensors or reader.    Sensor applied by patient to (L) Arm at .  Explained that glucose readings will not be available until 1 hour after application. Reviewed use of CGM including  how to scan, changing Sensor, Vitamin C warning, arrows with glucose readings, and Freestyle app. Patient very appreciative.   Thank you, Teddie Curd E. Bharath Bernstein, RN, MSN, CNS, CDCES  Diabetes Coordinator Inpatient Glycemic Control Team Team Pager [phone] (8am-5pm) 08/10/2024 11:47 AM

## 2024-08-10 NOTE — Inpatient Diabetes Management (Signed)
 Inpatient Diabetes Program Recommendations  AACE/ADA: New Consensus Statement on Inpatient Glycemic Control (2015)  Target Ranges:  Prepandial:   less than 140 mg/dL      Peak postprandial:   less than 180 mg/dL (1-2 hours)      Critically ill patients:  140 - 180 mg/dL   Lab Results  Component Value Date   GLUCAP 226 (H) 08/10/2024   HGBA1C 12.1 (H) 08/09/2024    Discharge Recommendations: Other recommendations: Dexcom G7 CGM [id number] Long acting recommendations: Insulin  Glargine (LANTUS ) Solostar Pen TBD  Short acting recommendations:  Meal coverage ONLY Insulin  aspart (NOVOLOG ) FlexPen  TBD   Hypoglycemia treatment recommendations: Baqsimi 3mg  Supply/Referral recommendations: Glucometer Test strips Lancet device Lancets Pen needles - standard Referral to Nutrition & Diab Services   Use Adult Diabetes Insulin  Treatment Post Discharge order set.  Thank you, Patrick Plunk E. Jacai Kipp, RN, MSN, CNS, CDCES  Diabetes Coordinator Inpatient Glycemic Control Team Team Pager [phone] (8am-5pm) 08/10/2024 11:49 AM

## 2024-08-10 NOTE — ED Notes (Signed)
 Called lab to ask about the 0200 BMP. Per my ED narrator, it shows specimen collected but do not see the blood work in process. BMP is ordered Q4 Main lab stated that the BMP was canceled and would need to be reordered. The order is still valid on my end. Sent message to provider @

## 2024-08-10 NOTE — ED Notes (Signed)
 Provider notified of CBG increasing x3. Endotool suggesting 15u/hr

## 2024-08-10 NOTE — ED Notes (Signed)
 CBG result of 201 was a defective strip and had to be replaced and redrawn. Using most recent value of 215 in endotool
